# Patient Record
Sex: Female | Born: 1985 | Race: White | Hispanic: No | Marital: Married | Smoking: Never smoker
Health system: Southern US, Community
[De-identification: ages and names within clinical notes are randomized; demographics above are authoritative.]

## PROBLEM LIST (undated history)

## (undated) ENCOUNTER — Inpatient Hospital Stay (HOSPITAL_COMMUNITY): Payer: Self-pay

## (undated) DIAGNOSIS — Z973 Presence of spectacles and contact lenses: Secondary | ICD-10-CM

## (undated) DIAGNOSIS — K219 Gastro-esophageal reflux disease without esophagitis: Secondary | ICD-10-CM

## (undated) DIAGNOSIS — Z141 Cystic fibrosis carrier: Secondary | ICD-10-CM

## (undated) DIAGNOSIS — J189 Pneumonia, unspecified organism: Secondary | ICD-10-CM

## (undated) DIAGNOSIS — O021 Missed abortion: Secondary | ICD-10-CM

## (undated) DIAGNOSIS — F32A Depression, unspecified: Secondary | ICD-10-CM

## (undated) HISTORY — DX: Cystic fibrosis carrier: Z14.1

---
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
## 2010-04-12 ENCOUNTER — Ambulatory Visit (INDEPENDENT_AMBULATORY_CARE_PROVIDER_SITE_OTHER): Payer: BC Managed Care – PPO | Admitting: Family Medicine

## 2010-04-12 ENCOUNTER — Encounter: Payer: Self-pay | Admitting: Family Medicine

## 2010-04-12 DIAGNOSIS — J029 Acute pharyngitis, unspecified: Secondary | ICD-10-CM

## 2010-04-12 DIAGNOSIS — J069 Acute upper respiratory infection, unspecified: Secondary | ICD-10-CM

## 2010-04-14 ENCOUNTER — Encounter: Payer: Self-pay | Admitting: Family Medicine

## 2010-04-20 NOTE — Assessment & Plan Note (Signed)
Summary: SORET HROAT,PAIN IN EARS,RUNNY NOSE /WSE Room 4   Vital Signs:  Patient Profile:   25 Years Old Female CC:      Sore throat, ear pain, congestion, chills x 3 days Height:     65.5 inches Weight:      105 pounds O2 Sat:      99 % O2 treatment:    Room Air Temp:     99.0 degrees F oral Pulse rate:   76 / minute Pulse rhythm:   regular Resp:     12 per minute BP sitting:   127 / 84  (left arm) Cuff size:   regular  Vitals Entered By: Emilio Math (April 12, 2010 8:05 PM)                  Current Allergies: ! PENICILLINHistory of Present Illness Chief Complaint: Sore throat, ear pain, congestion, chills x 3 days History of Present Illness:  Subjective: Patient complains of sore throat for 2 days. No cough No pleuritic pain No wheezing + nasal congestion ? post-nasal drainage No sinus pain/pressure No itchy/red eyes ? earache No hemoptysis No SOB No fever, + chills No nausea No vomiting No abdominal pain No diarrhea No skin rashes + fatigue No myalgias No headache Used OTC meds without relief   REVIEW OF SYSTEMS Constitutional Symptoms       Complains of chills.     Denies fever, night sweats, weight loss, weight gain, and fatigue.  Eyes       Denies change in vision, eye pain, eye discharge, glasses, contact lenses, and eye surgery. Ear/Nose/Throat/Mouth       Complains of ear pain, sinus problems, and sore throat.      Denies hearing loss/aids, change in hearing, ear discharge, dizziness, frequent runny nose, frequent nose bleeds, hoarseness, and tooth pain or bleeding.  Respiratory       Denies dry cough, productive cough, wheezing, shortness of breath, asthma, bronchitis, and emphysema/COPD.  Cardiovascular       Denies murmurs, chest pain, and tires easily with exhertion.    Gastrointestinal       Denies stomach pain, nausea/vomiting, diarrhea, constipation, blood in bowel movements, and indigestion. Genitourniary       Denies painful  urination, kidney stones, and loss of urinary control. Neurological       Denies paralysis, seizures, and fainting/blackouts. Musculoskeletal       Denies muscle pain, joint pain, joint stiffness, decreased range of motion, redness, swelling, muscle weakness, and gout.  Skin       Denies bruising, unusual mles/lumps or sores, and hair/skin or nail changes.  Psych       Denies mood changes, temper/anger issues, anxiety/stress, speech problems, depression, and sleep problems.  Past History:  Past Medical History: Unremarkable  Past Surgical History: Denies surgical history  Family History: Mother, Fibromyalgia Father, Healthy  Social History: Non smoker [facility]   Objective:  No acute distress  Eyes:  Pupils are equal, round, and reactive to light and accomdation.  Extraocular movement is intact.  Conjunctivae are not inflamed.  Ears:  Canals normal.  Tympanic membranes normal.   Nose:  Mildly congested; no sinus tenderness Pharynx:  Minimal erythema Neck:  Supple.  Tender shotty posterior nodes are palpated bilaterally.  Lungs:  Clear to auscultation.  Breath sounds are equal.  Heart:  Regular rate and rhythm without murmurs, rubs, or gallops.  Abdomen:  Nontender without masses or hepatosplenomegaly.  Bowel sounds are present.  No CVA or flank tenderness.  Skin:  No rash Rapid strep test negative  Assessment New Problems: UPPER RESPIRATORY INFECTION, ACUTE (ICD-465.9) ACUTE PHARYNGITIS (ICD-462)  NO EVIDENCE BACTERIAL INFECTION TODAY.  SUSPECT EARLY VIRAL URI  Plan New Medications/Changes: AZITHROMYCIN 250 MG TABS (AZITHROMYCIN) Two tabs by mouth on day 1, then 1 tab daily on days 2 through 5 (Rx void after 04/20/10)  #6 tabs x 0, 04/12/2010, Donna Christen MD BENZONATATE 200 MG CAPS (BENZONATATE) One by mouth hs as needed cough  #12 x 0, 04/12/2010, Donna Christen MD  New Orders: Rapid Strep [[address]] T-Culture, Throat [60454-09811] Pulse Oximetry  (single measurment) [91478] New Patient Level III [29562] Planning Comments:   Throat culture pending Treat symptomatically for now:  Increase fluid intake, begin expectorant/decongestant, cough suppressant at bedtime.  If throat culture positive, if fever/chills/sweats persist, or if not improving 5 days begin Z-pack (given Rx to hold).  Followup with PCP if not improving 7 to 10 days.   The patient and/or caregiver has been counseled thoroughly with regard to medications prescribed including dosage, schedule, interactions, rationale for use, and possible side effects and they verbalize understanding.  Diagnoses and expected course of recovery discussed and will return if not improved as expected or if the condition worsens. Patient and/or caregiver verbalized understanding.  Prescriptions: AZITHROMYCIN 250 MG TABS (AZITHROMYCIN) Two tabs by mouth on day 1, then 1 tab daily on days 2 through 5 (Rx void after 04/20/10)  #6 tabs x 0   Entered and Authorized by:   Donna Christen MD   Signed by:   Donna Christen MD on 04/12/2010   Method used:   Print then Give to Patient   RxID:   [id number] BENZONATATE 200 MG CAPS (BENZONATATE) One by mouth hs as needed cough  #12 x 0   Entered and Authorized by:   Donna Christen MD   Signed by:   Donna Christen MD on 04/12/2010   Method used:   Print then Give to Patient   RxID:   [id number]   Patient Instructions: 1)  Take Mucinex D (guaifenesin with decongestant) twice daily for congestion. 2)  Increase fluid intake, rest. 3)  May take Ibuprofen 200mg , 3 or 4 tabs every 8 hours with food for sore throat, fever, etc. 4)  May use Afrin nasal spray (or generic oxymetazoline) twice daily for about 5 days.  Also recommend using saline nasal spray several times daily and/or saline nasal irrigation. 5)  Begin Azithromycin if throat culture positive, if not improving about 5 days, or if persistent fever develops. 6)  Followup with family doctor if not  improving 7 to 10 days.   Orders Added: 1)  Rapid Strep [87880] 2)  T-Culture, Throat [[id number]] 3)  Pulse Oximetry (single measurment) [94760] 4)  New Patient Level III [99203]  Appended Document: SORET HROAT,PAIN IN EARS,RUNNY NOSE /WSE Room 4 Rapid strep: Negative

## 2010-04-20 NOTE — Letter (Signed)
Summary: Handout Printed  Printed Handout:  - Rheumatic Fever 

## 2010-04-20 NOTE — Letter (Signed)
Summary: Out of Work  [facility]  [address]   [place], [place] [address]   Phone: [phone]  Fax: [phone]    April 12, 2010   Employee:  Lamount Cohen    To Whom It May Concern:   For Medical reasons, please excuse the above named employee from work tomorrow.      If you need additional information, please feel free to contact our office.         Sincerely,    Donna Christen MD

## 2010-11-11 ENCOUNTER — Encounter: Payer: Self-pay | Admitting: Emergency Medicine

## 2010-11-11 ENCOUNTER — Inpatient Hospital Stay (INDEPENDENT_AMBULATORY_CARE_PROVIDER_SITE_OTHER)
Admission: RE | Admit: 2010-11-11 | Discharge: 2010-11-11 | Disposition: A | Discharge status: Home / Self Care | Payer: BC Managed Care – PPO | Source: Ambulatory Visit | Attending: Emergency Medicine | Admitting: Emergency Medicine

## 2010-11-11 DIAGNOSIS — J00 Acute nasopharyngitis [common cold]: Secondary | ICD-10-CM | POA: Insufficient documentation

## 2010-11-11 DIAGNOSIS — E860 Dehydration: Secondary | ICD-10-CM | POA: Insufficient documentation

## 2010-11-11 DIAGNOSIS — J029 Acute pharyngitis, unspecified: Secondary | ICD-10-CM

## 2010-11-11 LAB — CONVERTED CEMR LAB
Beta hcg, urine, semiquantitative: NEGATIVE
Bilirubin Urine: NEGATIVE
Blood in Urine, dipstick: NEGATIVE
Glucose, Urine, Semiquant: NEGATIVE
Heterophile Ab Screen: NEGATIVE
Inflenza A Ag: NEGATIVE
Influenza B Ag: NEGATIVE
Nitrite: NEGATIVE
Protein, U semiquant: NEGATIVE
Rapid Strep: NEGATIVE
Specific Gravity, Urine: 1.02
Urobilinogen, UA: 0.2
WBC Urine, dipstick: NEGATIVE
pH: 7.5

## 2010-11-15 ENCOUNTER — Telehealth (INDEPENDENT_AMBULATORY_CARE_PROVIDER_SITE_OTHER): Payer: Self-pay | Admitting: *Deleted

## 2011-01-03 NOTE — Progress Notes (Signed)
Summary: Fever/chills/ear pain/dizzy (rm 3)   Vital Signs:  Patient Profile:   25 Years Old Female CC:      dizziness, congestion, nausea, chills, HA x 1 week LMP:     10/23/2010 Height:     65.5 inches Weight:      108 pounds O2 Sat:      100 % O2 treatment:    Room Air Temp:     98.6 degrees F oral Pulse rate:   69 / minute Resp:     14 per minute BP sitting:   125 / 80  (left arm) Cuff size:   regular  Vitals Entered By: Lajean Saver RN (November 11, 2010 2:23 PM)  Menstrual History: LMP (date): 10/23/2010                  Updated Prior Medication List: * TRISPRINTEC   Current Allergies (reviewed today): ! PENICILLINHistory of Present Illness History from: patient and husband Chief Complaint: dizziness, congestion, nausea, chills, HA x 1 week History of Present Illness: Pt complains of 7 days of dizziness, congestion, nausea, chills, HA, myalgias.  Not improving with otc meds. + mild sore throat. + cough, + L ear pain. No dyspnea. No rash. No chest pain. No wheezing.  + nausea No vomiting, but + "dry heaves".  No diarrhea. Poor appetitie. + fever, + chills . Husband asymptomatic. Married last month. + mild colored rhinorrhea. +Fatigue and lightheaded without syncope. No gyn or gu sxs. No tick bite.  REVIEW OF SYSTEMS Constitutional Symptoms       Complains of chills and fatigue.     Denies fever, night sweats, weight loss, and weight gain.  Eyes       Denies change in vision, eye pain, eye discharge, glasses, contact lenses, and eye surgery. Ear/Nose/Throat/Mouth       Complains of ear pain and dizziness.      Denies hearing loss/aids, change in hearing, ear discharge, frequent runny nose, frequent nose bleeds, sinus problems, sore throat, hoarseness, and tooth pain or bleeding.  Respiratory       Complains of dry cough.      Denies productive cough, wheezing, shortness of breath, asthma, bronchitis, and emphysema/COPD.  Cardiovascular       Denies  murmurs, chest pain, and tires easily with exhertion.    Gastrointestinal       Complains of nausea/vomiting.      Denies stomach pain, diarrhea, constipation, blood in bowel movements, and indigestion.      Comments: nausea only Genitourniary       Denies painful urination, blood or discharge from vagina, kidney stones, and loss of urinary control. Neurological       Complains of weakness.      Denies headaches, loss of or changes in sensation, numbness, tngling, tremors, paralysis, seizures, and fainting/blackouts. Musculoskeletal       Denies muscle pain, joint pain, joint stiffness, decreased range of motion, redness, swelling, muscle weakness, and gout.  Skin       Denies bruising, unusual mles/lumps or sores, and hair/skin or nail changes.  Psych       Denies mood changes, temper/anger issues, anxiety/stress, speech problems, depression, and sleep problems. Other Comments: Symptoms started 1 week ago. Taken Mucinex D   Past History:  Past Medical History: Reviewed history from 04/12/2010 and no changes required. Unremarkable  Past Surgical History: Reviewed history from 04/12/2010 and no changes required. Denies surgical history  Social History: Non smoker [employer] [facility] [employer] Married  Physical Exam General appearance: Very fatigued. Cooperative. Appears Toxic. Here with husband. Lying on exam table. No cardioresp distress. Head: normocephalic, atraumatic Eyes: conjunctivae and lids normal Ears: normal, no lesions or deformities Nasal: clear discharge Oral/Pharynx: pharyngeal erythema without exudate, uvula midline without deviation. Oral mucus membranes dry. Neck: supple,anterior lymphadenopathy present. No meningeal signs. Chest/Lungs: no rales, wheezes, or rhonchi bilateral, breath sounds equal without effort Heart: Tachy; regular rate and  rhythm, no murmur Abdomen: soft, non-tender without obvious organomegaly GU: No cva tenderness Extremities: normal  extremities.  Neg Homans. Muscles and skin are diffusely tender and sensitive to palpation. Neurological: grossly intact and non-focal Skin: Warm. no obvious rashes or lesions.  Turgor decreased. MSE: oriented to time, place, and person Assessment New Problems: DEHYDRATION (ICD-276.51) ACUTE NASOPHARYNGITIS (ICD-460) ACUTE PHARYNGITIS (ICD-462)  stat rapid strep test, flu A and B tests, and monospot tests all NEG. UA and urine HCG neg.  Plan New Orders: Flu A+B [87400] Rapid Strep [87880] Monospot [86308] Est. Patient Level IV [[address]] Planning Comments:   Discussed with pt and husband.  Dx unclear. In my opinion, has febrile illness, likely viral syndrome, with dehydration.  She needs IV fluids and further w/u and care beyond the scope of our facility, which is an urgent care center. Explained to pt and husband. Husband will take Chelcea now to [facility] Emergency Department.   The patient and/or caregiver has been counseled thoroughly with regard to medications prescribed including dosage, schedule, interactions, rationale for use, and possible side effects and they verbalize understanding.  Diagnoses and expected course of recovery discussed and will return if not improved as expected or if the condition worsens. Patient and/or caregiver verbalized understanding.   Orders Added: 1)  Flu A+B [87400] 2)  Rapid Strep [87880] 3)  Monospot [86308] 4)  Est. Patient Level IV [60454]    Laboratory Results   Urine Tests  Date/Time Received: November 11, 2010 3:31 PM  Date/Time Reported: November 11, 2010 3:31 PM   Routine Urinalysis   Color: yellow Appearance: Clear Glucose: negative   (Normal Range: Negative) Bilirubin: negative   (Normal Range: Negative) Ketone: 1+   (Normal Range: Negative) Spec. Gravity: 1.020   (Normal Range: 1.003-1.035) Blood: negative   (Normal Range: Negative) pH: 7.5   (Normal Range: 5.0-8.0) Protein: negative   (Normal Range:  Negative) Urobilinogen: 0.2   (Normal Range: 0-1) Nitrite: negative   (Normal Range: Negative) Leukocyte Esterace: negative   (Normal Range: Negative)    Urine HCG: negative  Blood Tests   Date/Time Received: November 11, 2010 3:11 PM  Date/Time Reported: November 11, 2010 3:11 PM    Mono: negative Date/Time Received: November 11, 2010 3:14 PM  Date/Time Reported: November 11, 2010 3:14 PM   Other Tests  Rapid Strep: negative Influenza A: negative Influenza B: negative

## 2011-01-03 NOTE — Telephone Encounter (Signed)
  Phone Note Outgoing Call Call back at [facility] Phone [phone]   Call placed by: Lajean Saver RN,  November 15, 2010 3:21 PM Call placed to: Patient Action Taken: Phone Call Completed Summary of Call: Call back: No answer. Message left with reason for call and call back with questions or concerns

## 2011-01-14 ENCOUNTER — Encounter: Payer: Self-pay | Admitting: Family Medicine

## 2011-01-14 ENCOUNTER — Ambulatory Visit (INDEPENDENT_AMBULATORY_CARE_PROVIDER_SITE_OTHER): Payer: BC Managed Care – PPO | Admitting: Family Medicine

## 2011-01-14 VITALS — BP 90/51 | HR 77 | Ht 60.0 in | Wt 110.0 lb

## 2011-01-14 DIAGNOSIS — Z13220 Encounter for screening for lipoid disorders: Secondary | ICD-10-CM

## 2011-01-14 DIAGNOSIS — H6980 Other specified disorders of Eustachian tube, unspecified ear: Secondary | ICD-10-CM

## 2011-01-14 DIAGNOSIS — H6990 Unspecified Eustachian tube disorder, unspecified ear: Secondary | ICD-10-CM

## 2011-01-14 DIAGNOSIS — Z8249 Family history of ischemic heart disease and other diseases of the circulatory system: Secondary | ICD-10-CM

## 2011-01-14 DIAGNOSIS — Z3009 Encounter for other general counseling and advice on contraception: Secondary | ICD-10-CM

## 2011-01-14 MED ORDER — NORGESTIM-ETH ESTRAD TRIPHASIC 0.18/0.215/0.25 MG-35 MCG PO TABS: 1.0000 | ORAL_TABLET | Freq: Every day | ORAL | Status: DC

## 2011-01-14 NOTE — Patient Instructions (Signed)
Call if you need anything! 

## 2011-01-14 NOTE — Progress Notes (Signed)
Subjective:    Patient ID: Jennifer Ryan, female    DOB: 1985/11/24, 24 y.o.   MRN: [id number]  HPI Here to estab care.  Left ear ache in the morning. Comes and goes for a month.  No drange. No fever.  Occ ST.    Would like a refill on her OCPS. Happy with current regimen.    Review of Systems  Constitutional: Negative for fever, diaphoresis and unexpected weight change.  HENT: Negative for hearing loss, rhinorrhea and tinnitus.   Eyes: Negative for visual disturbance.  Respiratory: Negative for cough and wheezing.   Cardiovascular: Negative for chest pain and palpitations.  Gastrointestinal: Negative for nausea, vomiting, diarrhea and blood in stool.  Genitourinary: Negative for vaginal bleeding, vaginal discharge and difficulty urinating.  Musculoskeletal: Negative for myalgias and arthralgias.  Skin: Negative for rash.  Neurological: Negative for headaches.  Hematological: Negative for adenopathy. Does not bruise/bleed easily.  Psychiatric/Behavioral: Negative for sleep disturbance and dysphoric mood. The patient is not nervous/anxious.    BP 90/51  Pulse 77  Ht 5' (1.524 m)  Wt 110 lb (49.896 kg)  BMI 21.48 kg/m2    Allergies  Allergen Reactions  . Penicillins Rash    History reviewed. No pertinent past medical history.  Past Surgical History  Procedure Date  . Wisdom tooth extraction 2007    History   Social History  . Marital Status: Married    Spouse Name: [name]    Number of Children: 1   . Years of Education: N/A   Occupational History  . Pharmacy [employer]   Social History Main Topics  . Smoking status: Never Smoker   . Smokeless tobacco: Not on file  . Alcohol Use: No  . Drug Use: No  . Sexually Active: Yes -- Female partner(s)    Birth Control/ Protection: Pill   Other Topics Concern  . Not on file   Social History Narrative    2 caffiene per day. No regular exercise routine.     Family History  Problem Relation Age  of Onset  . Prostate cancer Paternal Grandfather   . Brain cancer      pat great grandfather  . Heart attack Paternal Uncle   . Depression Maternal Aunt   . Diabetes      cousins   . Hyperlipidemia    . Hypertension Paternal Grandfather   . Hypertension Paternal Grandmother        Objective:   Physical Exam  Constitutional: She is oriented to person, place, and time. She appears well-developed and well-nourished.  HENT:  Head: Normocephalic and atraumatic.  Right Ear: External ear normal.  Left Ear: External ear normal.  Nose: Nose normal.  Mouth/Throat: Oropharynx is clear and moist.       TMs and canals are clear.   Eyes: Conjunctivae and EOM are normal. Pupils are equal, round, and reactive to light.  Neck: Neck supple. No thyromegaly present.  Cardiovascular: Normal rate, regular rhythm and normal heart sounds.   Pulmonary/Chest: Effort normal and breath sounds normal. She has no wheezes.  Lymphadenopathy:    She has no cervical adenopathy.  Neurological: She is alert and oriented to person, place, and time.  Skin: Skin is warm and dry.  Psychiatric: She has a normal mood and affect.          Assessment & Plan:  Birth control - Refilled medication. Last pap was 05/2010.  Gve 6 months worth, then need repeat  pap smear  Left ear Pain - Likley eustachian tube dysfunciton.  Exam is nl. Discussed can try a decongetant or an antihistamine if gets more bothersome.

## 2011-07-05 ENCOUNTER — Encounter: Payer: Self-pay | Admitting: *Deleted

## 2011-07-05 ENCOUNTER — Ambulatory Visit (INDEPENDENT_AMBULATORY_CARE_PROVIDER_SITE_OTHER): Payer: BC Managed Care – PPO | Admitting: *Deleted

## 2011-07-05 VITALS — BP 119/71 | Temp 97.5°F | Wt 111.0 lb

## 2011-07-05 DIAGNOSIS — Z3400 Encounter for supervision of normal first pregnancy, unspecified trimester: Secondary | ICD-10-CM

## 2011-07-05 NOTE — Progress Notes (Signed)
p-98  This is a G1 P0 who is here with here husband for their NOB intake.  She is the sister of a previous pt who delivered through our practice.  They are happy about this pregnancy.  He has a child from a previous marriage.  Bedside U/S today shows CRL of 17.44mm and FHT of 178bpm. Her last pap was 1 year ago and denies any abnormal paps.  She is currently taking prenatal vitamins.  She was counseled about 1ST Trimester screening and will decide by her next appt which will be in about 2 weeks

## 2011-07-05 NOTE — Progress Notes (Signed)
Addended by: Granville Lewis on: 07/05/2011 11:34 AM   Modules accepted: Level of Service

## 2011-07-06 LAB — OBSTETRIC PANEL
Basophils Relative: 0 % (ref 0–1)
Hemoglobin: 13.1 g/dL (ref 12.0–15.0)
Hepatitis B Surface Ag: NEGATIVE
Lymphs Abs: 1.2 10*3/uL (ref 0.7–4.0)
MCHC: 32.5 g/dL (ref 30.0–36.0)
Monocytes Relative: 7 % (ref 3–12)
Neutro Abs: 7.4 10*3/uL (ref 1.7–7.7)
Neutrophils Relative %: 79 % — ABNORMAL HIGH (ref 43–77)
Platelets: 295 10*3/uL (ref 150–400)
RBC: 4.46 MIL/uL (ref 3.87–5.11)
Rh Type: POSITIVE

## 2011-07-06 LAB — GC/CHLAMYDIA PROBE AMP, URINE
Chlamydia, Swab/Urine, PCR: NEGATIVE
GC Probe Amp, Urine: NEGATIVE

## 2011-07-07 LAB — CULTURE, URINE COMPREHENSIVE: Organism ID, Bacteria: NO GROWTH

## 2011-07-14 LAB — CYSTIC FIBROSIS DIAGNOSTIC STUDY

## 2011-07-15 ENCOUNTER — Telehealth: Payer: Self-pay | Admitting: *Deleted

## 2011-07-15 DIAGNOSIS — IMO0002 Reserved for concepts with insufficient information to code with codable children: Secondary | ICD-10-CM

## 2011-07-15 NOTE — Telephone Encounter (Signed)
Appt made with genetic counseling @ [facility] 07/28/11 @ 1:00

## 2011-07-17 ENCOUNTER — Encounter: Payer: Self-pay | Admitting: Obstetrics & Gynecology

## 2011-07-17 DIAGNOSIS — Z141 Cystic fibrosis carrier: Secondary | ICD-10-CM | POA: Insufficient documentation

## 2011-07-19 ENCOUNTER — Encounter (HOSPITAL_COMMUNITY): Payer: Self-pay | Admitting: Obstetrics & Gynecology

## 2011-07-22 ENCOUNTER — Ambulatory Visit (INDEPENDENT_AMBULATORY_CARE_PROVIDER_SITE_OTHER): Payer: BC Managed Care – PPO | Admitting: Advanced Practice Midwife

## 2011-07-22 DIAGNOSIS — Z3480 Encounter for supervision of other normal pregnancy, unspecified trimester: Secondary | ICD-10-CM

## 2011-07-22 NOTE — Progress Notes (Signed)
p-85  C/O's lower back pain

## 2011-07-22 NOTE — Progress Notes (Signed)
  Subjective:    Jennifer Ryan is a G1P0 [redacted]w[redacted]d being seen today for her first obstetrical visit.  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 Patient does intend to breast feed. Pregnancy history fully reviewed. OB labs reviewed.  Patient reports no bleeding, no contractions, no cramping and no leaking.  Filed Vitals:   07/22/11 0928  BP: 115/62  Temp: 98 F (36.7 C)  Weight: 50.349 kg (111 lb)    HISTORY: OB History    Grav Para Term Preterm Abortions TAB SAB Ect Mult Living   1              # Outc Date GA Lbr Len/2nd Wgt Sex Del Anes PTL Lv   1 CUR              No past medical history on file. Past Surgical History  Procedure Date  . Wisdom tooth extraction 2007   Family History  Problem Relation Age of Onset  . Prostate cancer Paternal Grandfather   . Brain cancer      pat great grandfather  . Heart attack Paternal Uncle   . Depression Maternal Aunt   . Diabetes      cousins   . Hyperlipidemia    . Hypertension Paternal Grandfather   . Hypertension Paternal Grandmother      Exam    Uterus:     Pelvic Exam:    Perineum: Deferred   Vulva: Deferred   Vagina:  Deferred   pH: NA   Cervix: Deferred   Adnexa: not evaluated   Bony Pelvis: Deferred  System: Breast:  normal appearance, no masses or tenderness   Skin: normal coloration and turgor, no rashes    Neurologic: oriented, normal, gait normal; reflexes normal and symmetric   Extremities: normal strength, tone, and muscle mass   HEENT PERRLA, sclera clear, anicteric and thyroid without masses   Mouth/Teeth mucous membranes moist, pharynx normal without lesions   Neck supple   Cardiovascular: regular rate and rhythm   Respiratory:  appears well, vitals normal, no respiratory distress, acyanotic, normal RR, neck free of mass or lymphadenopathy, chest clear, no wheezing, crepitations, rhonchi, normal symmetric air entry   Abdomen: soft, non-tender; bowel sounds normal; no masses,  no  organomegaly   Urinary: Deferred      Assessment:    Pregnancy: G1P0 Patient Active Problem List  Diagnosis  . Cystic fibrosis gene carrier   Plan:   Initial labs drawn. Prenatal vitamins. Problem list reviewed and updated. Genetic Screening discussed First Screen: undecided. Will call Monday to schedule PRN   Ultrasound discussed; fetal survey: at 18-20 weeks.  Follow up in 4 weeks. 75% of 30 min visit spent on counseling and coordination of care.  Husband scheduled for CF carrier testing.  Dorathy Kinsman 07/22/2011

## 2011-07-28 ENCOUNTER — Ambulatory Visit (HOSPITAL_COMMUNITY)
Admission: RE | Admit: 2011-07-28 | Discharge: 2011-07-28 | Disposition: A | Discharge status: Home / Self Care | Payer: BC Managed Care – PPO | Source: Ambulatory Visit | Attending: Obstetrics & Gynecology | Admitting: Obstetrics & Gynecology

## 2011-07-28 ENCOUNTER — Other Ambulatory Visit: Payer: Self-pay

## 2011-07-28 NOTE — Progress Notes (Signed)
Genetic Counseling  High-Risk Gestation Note  Appointment Date:  07/28/2011 Referred By: Lesly Dukes, MD Date of Birth:  Jan 11, 1986   Pregnancy History: G1P0 Estimated Date of Delivery: 02/09/12 Estimated Gestational Age: [redacted]w[redacted]d Attending: Rica Koyanagi, MD   I met with Mrs. Robbie Lis and her husband, Mr. 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    We reviewed the results of Mrs. Cabana CF carrier screening.  Specifically, the name of the CFTR gene mutation she carries is 2789+5G>A. CF carrier screening has not yet been performed for her husband.  Mrs. Zuercher reported no additional relatives known to be CF carriers and no known relatives with cystic fibrosis.  Mr. Dlugosz reported no known individuals with cystic fibrosis in his family history, and consanguinity to Mrs. Searcy was denied. He reported [place], [place], [place], [place], and [place] ancestry. Both family histories were reviewed and were otherwise negative for birth defects, mental retardation, and known genetic conditions. Without further information regarding the provided family history, an accurate genetic risk cannot be calculated. Further genetic counseling is warranted if more information is obtained.  The couple was provided with written information regarding cystic fibrosis. Classic features of CF include thickened secretions in the lungs, digestive and reproductive systems.  This life-limiting condition is characterized by chronic respiratory infections requiring daily chest therapies, pancreatic dysfunction disrupting the body's ability to break down food and extract nutrients as it should, which may restrict growth.  Infertility commonly occurs in males.  With therapies, such as daily respiratory therapies and medications to aid digestion, the median lifespan for people with CF is now mid-30's. Treatment  may involve lung transplantation in some cases. There can be significant variability in the severity of symptoms and expression of the disease; severity cannot be predicted prenatally.  We discussed that 2789+5G>A mutation is associated in the literature with milder forms of the disease. However, expression of the disease may be modified depending upon the second mutation present in an individual with CF.   We spent time reviewing the autosomal recessive inheritance of cystic fibrosis. Cystic fibrosis (CF) is a common genetic condition in the Caucasian population occurring in approximately 1 in 2,500-3,300 Caucasian births.  This means approximately 1 in 29 Caucasians is a CF carrier.  We discussed that individuals who are carriers have one copy of the CFTR gene with a disease causing mutation, and their other CFTR gene copy functions correctly. Thus, carriers typically do not have associated medical symptoms. We discussed that when both parents are carriers for CF, each pregnancy has an independent chance for one of the following outcomes: a 25% chance to inherit both mutations and thus have CF; a 50% chance to inherit one gene mutation and be a carrier similar to parents; and a 25% chance to be neither a carrier nor have CF. When one parent is a CF carrier but the other is not, then each pregnancy has a 1 in 2 chance to be a CF carrier but would not be expected to be at increased risk to inherit CF.   There are thought to be thousands of mutations which can cause the CFTR gene to not function properly. Carrier screening is available to assess for the most common disease causing mutations. However, carrier screening does not identify all CF carriers. Thus, a negative CF carrier screen would reduce but not eliminate the chance to be a CF carrier and thus  the chance for CF in a pregnancy.  Carrier screening for the most common mutations detects approximately 90% of carriers in the Caucasian population.  Given  Mr. Grimme' reported family history, he would have the general population risk to be a carrier of approximately 1 in 29, prior to carrier screening. Thus, given that Mrs. Deryl [place] is a known CF carrier, the risk for CF to be in the current pregnancy, prior to carrier screening for Mr. Tetzloff, is approximately 1 in 116 (0.9%).  We discussed that CF carrier screening for Mr. Lampe would further refine the risk for CF in the current pregnancy. After careful consideration, Mr. Pigman elected to proceed with CF carrier screening using the extended panel (97 mutation) through [employer], which was performed at the time of today's visit. We will contact the patient and her partner once these results are available and forward to her OB office.  We reviewed that when both parents are identified to be CF carriers, prenatal diagnosis via amniocentesis (or chorionic villus sampling in the first trimester) would be available, if desired. The risks, benefits, and limitations of amniocentesis were reviewed. A fetus with cystic fibrosis typically appears normal on targeted ultrasound, although rarely echogenic bowel is visualized on ultrasound. However, the presence of echogenic bowel on targeted ultrasound is not diagnostic for CF in a pregnancy, nor does the absence of echogenic bowel on ultrasound rule out CF in the pregnancy. We discussed that postnatal testing for CF can also be performed for babies identified to be at risk to inherit CF. They understand that in [place], the newborn screening test will detect CF, but that carriers may come back as false positives.  Mrs. Chantae Soo [facility] and her partner stated that they would not be interested in prenatal diagnosis for CF via amniocentesis, even in the event that Mr. Necaise was also identified to be a CF carrier given the associated risk of complications.  Mrs. Osley denied exposure to environmental toxins or chemical agents. She denied the  use of alcohol, tobacco or street drugs. She denied significant viral illnesses during the course of her pregnancy. Her medical and surgical histories were noncontributory.   I counseled this couple regarding the above risks and available options.  The approximate face-to-face time with the genetic counselor was  minutes.   Quinn Plowman, MS Certified Genetic Counselor 07/28/2011

## 2011-08-09 ENCOUNTER — Telehealth (HOSPITAL_COMMUNITY): Payer: Self-pay | Admitting: MS"

## 2011-08-09 NOTE — Telephone Encounter (Signed)
Called Mr. Jennifer Ryan, husband of [name] [facility] regarding result of his cystic fibrosis carrier screening. Discussed that screening is negative for the 97 mutations analyzed. Thus, his risk to be a CF carrier has been reduced to approximately 1 in 343 (0.3%), and the risk for cystic fibrosis in the current pregnancy has been reduced to approximately 1 in 1,372 (0.07%). [name] was encouraged to call back with additional questions or concerns.   [name] 08/09/2011  1:04 PM

## 2011-08-25 ENCOUNTER — Ambulatory Visit (INDEPENDENT_AMBULATORY_CARE_PROVIDER_SITE_OTHER): Payer: BC Managed Care – PPO | Admitting: Obstetrics & Gynecology

## 2011-08-25 ENCOUNTER — Encounter: Payer: Self-pay | Admitting: Obstetrics & Gynecology

## 2011-08-25 VITALS — BP 111/65 | Wt 116.0 lb

## 2011-08-25 DIAGNOSIS — Z3400 Encounter for supervision of normal first pregnancy, unspecified trimester: Secondary | ICD-10-CM

## 2011-08-25 NOTE — Progress Notes (Signed)
Routine visit. No problems. She is undecided about a quad screen and knows that she needs to decide by her next visit. I will schedule an anatomy scan for 19 weeks.

## 2011-08-25 NOTE — Progress Notes (Signed)
Routine prenatal check doing well.

## 2011-09-05 ENCOUNTER — Encounter: Payer: Self-pay | Admitting: Obstetrics & Gynecology

## 2011-09-05 ENCOUNTER — Other Ambulatory Visit: Payer: Self-pay | Admitting: Obstetrics & Gynecology

## 2011-09-05 ENCOUNTER — Ambulatory Visit (HOSPITAL_COMMUNITY)
Admission: RE | Admit: 2011-09-05 | Discharge: 2011-09-05 | Disposition: A | Discharge status: Home / Self Care | Payer: Medicaid Other | Source: Ambulatory Visit | Attending: Obstetrics & Gynecology | Admitting: Obstetrics & Gynecology

## 2011-09-05 DIAGNOSIS — Z3400 Encounter for supervision of normal first pregnancy, unspecified trimester: Secondary | ICD-10-CM

## 2011-09-05 DIAGNOSIS — Z1389 Encounter for screening for other disorder: Secondary | ICD-10-CM | POA: Insufficient documentation

## 2011-09-05 DIAGNOSIS — O358XX0 Maternal care for other (suspected) fetal abnormality and damage, not applicable or unspecified: Secondary | ICD-10-CM | POA: Insufficient documentation

## 2011-09-05 DIAGNOSIS — Z363 Encounter for antenatal screening for malformations: Secondary | ICD-10-CM | POA: Insufficient documentation

## 2011-09-23 ENCOUNTER — Ambulatory Visit (INDEPENDENT_AMBULATORY_CARE_PROVIDER_SITE_OTHER): Payer: Medicaid Other | Admitting: Advanced Practice Midwife

## 2011-09-23 VITALS — BP 115/55 | Temp 97.4°F | Wt 119.0 lb

## 2011-09-23 DIAGNOSIS — Z3490 Encounter for supervision of normal pregnancy, unspecified, unspecified trimester: Secondary | ICD-10-CM | POA: Insufficient documentation

## 2011-09-23 DIAGNOSIS — Z3480 Encounter for supervision of other normal pregnancy, unspecified trimester: Secondary | ICD-10-CM

## 2011-09-23 DIAGNOSIS — Z141 Cystic fibrosis carrier: Secondary | ICD-10-CM

## 2011-09-23 NOTE — Progress Notes (Signed)
Reports few sharp groin pain at end of work day c/w round ligamant pains. Anatomy scan normal, but incomplete. F/U scheduled.  Husband neg CF carrier.

## 2011-09-23 NOTE — Progress Notes (Signed)
[id number] 

## 2011-10-06 ENCOUNTER — Ambulatory Visit (HOSPITAL_COMMUNITY)
Admission: RE | Admit: 2011-10-06 | Discharge: 2011-10-06 | Disposition: A | Discharge status: Home / Self Care | Payer: Medicaid Other | Source: Ambulatory Visit | Attending: Advanced Practice Midwife | Admitting: Advanced Practice Midwife

## 2011-10-06 DIAGNOSIS — Z3490 Encounter for supervision of normal pregnancy, unspecified, unspecified trimester: Secondary | ICD-10-CM

## 2011-10-06 DIAGNOSIS — Z3689 Encounter for other specified antenatal screening: Secondary | ICD-10-CM | POA: Insufficient documentation

## 2011-10-06 NOTE — Progress Notes (Signed)
Ms. Lippert had an ultrasound appointment today.  Please see [facility] report for details.  Comments An active singleton fetus is observed.  Biometry is appropriate for gestational age.  Amniotic fluid volume is normal.  Screening survey of the fetal anatomy was performed and no dysmorphic features are detected, noting that imaging of the fetal profile is suboptimal due to position.  Impression Active singleton fetus Normal anatomic survey on attempted comprehensive anatomic survey limited by position. Fetal profile is not well seen.  Recommendations Follow up ultrasound in 4 weeks to complete anatomic survey of the fetal profile.    Rogelia Boga, MD, MS, FACOG Assistant Professor Section of Maternal-Fetal Medicine [facility]

## 2011-10-08 ENCOUNTER — Encounter (HOSPITAL_COMMUNITY): Payer: Self-pay | Admitting: Obstetrics and Gynecology

## 2011-10-08 ENCOUNTER — Inpatient Hospital Stay (HOSPITAL_COMMUNITY)
Admission: AD | Admit: 2011-10-08 | Discharge: 2011-10-08 | Disposition: A | Discharge status: Home / Self Care | Payer: Medicaid Other | Source: Ambulatory Visit | Attending: Obstetrics and Gynecology | Admitting: Obstetrics and Gynecology

## 2011-10-08 DIAGNOSIS — Z3490 Encounter for supervision of normal pregnancy, unspecified, unspecified trimester: Secondary | ICD-10-CM

## 2011-10-08 DIAGNOSIS — O99891 Other specified diseases and conditions complicating pregnancy: Secondary | ICD-10-CM | POA: Insufficient documentation

## 2011-10-08 DIAGNOSIS — O26899 Other specified pregnancy related conditions, unspecified trimester: Secondary | ICD-10-CM

## 2011-10-08 DIAGNOSIS — R109 Unspecified abdominal pain: Secondary | ICD-10-CM | POA: Insufficient documentation

## 2011-10-08 DIAGNOSIS — Z3480 Encounter for supervision of other normal pregnancy, unspecified trimester: Secondary | ICD-10-CM

## 2011-10-08 LAB — URINALYSIS, ROUTINE W REFLEX MICROSCOPIC
Ketones, ur: NEGATIVE mg/dL
Leukocytes, UA: NEGATIVE
Nitrite: NEGATIVE
Protein, ur: NEGATIVE mg/dL

## 2011-10-08 MED ORDER — LACTATED RINGERS IV BOLUS (SEPSIS)
1000.0000 mL | Freq: Once | INTRAVENOUS | Status: AC
  Administered 2011-10-08: 1000 mL via INTRAVENOUS

## 2011-10-08 NOTE — MAU Note (Signed)
Pt [place] and started feeling sharp abd pain and pelvic pressure. Pt unable to walk Husband had to carry her.

## 2011-10-08 NOTE — Discharge Instructions (Signed)
Abdominal Pain During Pregnancy Belly (abdominal) pain is common during pregnancy. Most of the time, it is not a serious problem. Other times, it can be a sign that something is wrong with the pregnancy. Always tell your doctor if you have belly pain. HOME CARE For mild pain:  Do not have sex (intercourse) or put anything in your vagina until you feel better.   Rest until your pain stops. If your pain lasts longer than 1 hour, call your doctor.   Drink clear fluids if you feel sick to your stomach (nauseous).   Do not eat solid food until you feel better.   Only take medicine as told by your doctor.   Keep all doctor visits as told.  GET HELP RIGHT AWAY IF:   You are bleeding, leaking fluid, or pieces of tissue come out of your vagina.   You have more pain or cramping.   You keep throwing up (vomiting).   You have pain when you pee (urinate) or have blood in your pee.   You have a fever.   You do not feel your baby moving as much.   You feel very weak or feel like passing out.   You have trouble breathing, with or without belly pain.   You have a very bad headache and belly pain.   You have fluid leaking from your vagina and belly pain.   You keep having watery poop (diarrhea).   Your belly pain does not go away after resting, or the pain gets worse.  MAKE SURE YOU:   Understand these instructions.   Will watch your condition.   Will get help right away if you are not doing well or get worse.  Document Released: 01/05/2009 Document Revised: 01/06/2011 Document Reviewed: 08/13/2010 [facility] Patient Information 2012 [place], [place]. Pregnancy - Second Trimester The second trimester of pregnancy (3 to 6 months) is a period of rapid growth for you and your baby. At the end of the sixth month, your baby is about 9 inches long and weighs 1 1/2 pounds. You will begin to feel the baby move between 18 and 20 weeks of the pregnancy. This is called quickening. Weight gain is  faster. A clear fluid (colostrum) may leak out of your breasts. You may feel small contractions of the womb (uterus). This is known as false labor or Braxton-Hicks contractions. This is like a practice for labor when the baby is ready to be born. Usually, the problems with morning sickness have usually passed by the end of your first trimester. Some women develop small dark blotches (called cholasma, mask of pregnancy) on their face that usually goes away after the baby is born. Exposure to the sun makes the blotches worse. Acne may also develop in some pregnant women and pregnant women who have acne, may find that it goes away. PRENATAL EXAMS  Blood work may continue to be done during prenatal exams. These tests are done to check on your health and the probable health of your baby. Blood work is used to follow your blood levels (hemoglobin). Anemia (low hemoglobin) is common during pregnancy. Iron and vitamins are given to help prevent this. You will also be checked for diabetes between 24 and 28 weeks of the pregnancy. Some of the previous blood tests may be repeated.   The size of the uterus is measured during each visit. This is to make sure that the baby is continuing to grow properly according to the dates of the pregnancy.  Your blood pressure is checked every prenatal visit. This is to make sure you are not getting toxemia.   Your urine is checked to make sure you do not have an infection, diabetes or protein in the urine.   Your weight is checked often to make sure gains are happening at the suggested rate. This is to ensure that both you and your baby are growing normally.   Sometimes, an ultrasound is performed to confirm the proper growth and development of the baby. This is a test which bounces harmless sound waves off the baby so your caregiver can more accurately determine due dates.  Sometimes, a specialized test is done on the amniotic fluid surrounding the baby. This test is called  an amniocentesis. The amniotic fluid is obtained by sticking a needle into the belly (abdomen). This is done to check the chromosomes in instances where there is a concern about possible genetic problems with the baby. It is also sometimes done near the end of pregnancy if an early delivery is required. In this case, it is done to help make sure the baby's lungs are mature enough for the baby to live outside of the womb. CHANGES OCCURING IN THE SECOND TRIMESTER OF PREGNANCY Your body goes through many changes during pregnancy. They vary from person to person. Talk to your caregiver about changes you notice that you are concerned about.  During the second trimester, you will likely have an increase in your appetite. It is normal to have cravings for certain foods. This varies from person to person and pregnancy to pregnancy.   Your lower abdomen will begin to bulge.   You may have to urinate more often because the uterus and baby are pressing on your bladder. It is also common to get more bladder infections during pregnancy (pain with urination). You can help this by drinking lots of fluids and emptying your bladder before and after intercourse.   You may begin to get stretch marks on your hips, abdomen, and breasts. These are normal changes in the body during pregnancy. There are no exercises or medications to take that prevent this change.   You may begin to develop swollen and bulging veins (varicose veins) in your legs. Wearing support hose, elevating your feet for 15 minutes, 3 to 4 times a day and limiting salt in your diet helps lessen the problem.   Heartburn may develop as the uterus grows and pushes up against the stomach. Antacids recommended by your caregiver helps with this problem. Also, eating smaller meals 4 to 5 times a day helps.   Constipation can be treated with a stool softener or adding bulk to your diet. Drinking lots of fluids, vegetables, fruits, and whole grains are helpful.     Exercising is also helpful. If you have been very active up until your pregnancy, most of these activities can be continued during your pregnancy. If you have been less active, it is helpful to start an exercise program such as walking.   Hemorrhoids (varicose veins in the rectum) may develop at the end of the second trimester. Warm sitz baths and hemorrhoid cream recommended by your caregiver helps hemorrhoid problems.   Backaches may develop during this time of your pregnancy. Avoid heavy lifting, wear low heal shoes and practice good posture to help with backache problems.   Some pregnant women develop tingling and numbness of their hand and fingers because of swelling and tightening of ligaments in the wrist (carpel tunnel syndrome). This goes  away after the baby is born.   As your breasts enlarge, you may have to get a bigger bra. Get a comfortable, cotton, support bra. Do not get a nursing bra until the last month of the pregnancy if you will be nursing the baby.   You may get a dark line from your belly button to the pubic area called the linea nigra.   You may develop rosy cheeks because of increase blood flow to the face.   You may develop spider looking lines of the face, neck, arms and chest. These go away after the baby is born.  HOME CARE INSTRUCTIONS   It is extremely important to avoid all smoking, herbs, alcohol, and unprescribed drugs during your pregnancy. These chemicals affect the formation and growth of the baby. Avoid these chemicals throughout the pregnancy to ensure the delivery of a healthy infant.   Most of your home care instructions are the same as suggested for the first trimester of your pregnancy. Keep your caregiver's appointments. Follow your caregiver's instructions regarding medication use, exercise and diet.   During pregnancy, you are providing food for you and your baby. Continue to eat regular, well-balanced meals. Choose foods such as meat, fish, milk  and other low fat dairy products, vegetables, fruits, and whole-grain breads and cereals. Your caregiver will tell you of the ideal weight gain.   A physical sexual relationship may be continued up until near the end of pregnancy if there are no other problems. Problems could include early (premature) leaking of amniotic fluid from the membranes, vaginal bleeding, abdominal pain, or other medical or pregnancy problems.   Exercise regularly if there are no restrictions. Check with your caregiver if you are unsure of the safety of some of your exercises. The greatest weight gain will occur in the last 2 trimesters of pregnancy. Exercise will help you:   Control your weight.   Get you in shape for labor and delivery.   Lose weight after you have the baby.   Wear a good support or jogging bra for breast tenderness during pregnancy. This may help if worn during sleep. Pads or tissues may be used in the bra if you are leaking colostrum.   Do not use hot tubs, steam rooms or saunas throughout the pregnancy.   Wear your seat belt at all times when driving. This protects you and your baby if you are in an accident.   Avoid raw meat, uncooked cheese, cat litter boxes and soil used by cats. These carry germs that can cause birth defects in the baby.   The second trimester is also a good time to visit your dentist for your dental health if this has not been done yet. Getting your teeth cleaned is OK. Use a soft toothbrush. Brush gently during pregnancy.   It is easier to loose urine during pregnancy. Tightening up and strengthening the pelvic muscles will help with this problem. Practice stopping your urination while you are going to the bathroom. These are the same muscles you need to strengthen. It is also the muscles you would use as if you were trying to stop from passing gas. You can practice tightening these muscles up 10 times a set and repeating this about 3 times per day. Once you know what  muscles to tighten up, do not perform these exercises during urination. It is more likely to contribute to an infection by backing up the urine.   Ask for help if you have financial, counseling  or nutritional needs during pregnancy. Your caregiver will be able to offer counseling for these needs as well as refer you for other special needs.   Your skin may become oily. If so, wash your face with mild soap, use non-greasy moisturizer and oil or cream based makeup.  MEDICATIONS AND DRUG USE IN PREGNANCY  Take prenatal vitamins as directed. The vitamin should contain 1 milligram of folic acid. Keep all vitamins out of reach of children. Only a couple vitamins or tablets containing iron may be fatal to a baby or young child when ingested.   Avoid use of all medications, including herbs, over-the-counter medications, not prescribed or suggested by your caregiver. Only take over-the-counter or prescription medicines for pain, discomfort, or fever as directed by your caregiver. Do not use aspirin.   Let your caregiver also know about herbs you may be using.   Alcohol is related to a number of birth defects. This includes fetal alcohol syndrome. All alcohol, in any form, should be avoided completely. Smoking will cause low birth rate and premature babies.   Street or illegal drugs are very harmful to the baby. They are absolutely forbidden. A baby born to an addicted mother will be addicted at birth. The baby will go through the same withdrawal an adult does.  SEEK MEDICAL CARE IF:  You have any concerns or worries during your pregnancy. It is better to call with your questions if you feel they cannot wait, rather than worry about them. SEEK IMMEDIATE MEDICAL CARE IF:   An unexplained oral temperature above 102 F (38.9 C) develops, or as your caregiver suggests.   You have leaking of fluid from the vagina (birth canal). If leaking membranes are suspected, take your temperature and tell your caregiver  of this when you call.   There is vaginal spotting, bleeding, or passing clots. Tell your caregiver of the amount and how many pads are used. Light spotting in pregnancy is common, especially following intercourse.   You develop a bad smelling vaginal discharge with a change in the color from clear to white.   You continue to feel sick to your stomach (nauseated) and have no relief from remedies suggested. You vomit blood or coffee ground-like materials.   You lose more than 2 pounds of weight or gain more than 2 pounds of weight over 1 week, or as suggested by your caregiver.   You notice swelling of your face, hands, feet, or legs.   You get exposed to [place] measles and have never had them.   You are exposed to fifth disease or chickenpox.   You develop belly (abdominal) pain. Round ligament discomfort is a common non-cancerous (benign) cause of abdominal pain in pregnancy. Your caregiver still must evaluate you.   You develop a bad headache that does not go away.   You develop fever, diarrhea, pain with urination, or shortness of breath.   You develop visual problems, blurry, or double vision.   You fall or are in a car accident or any kind of trauma.   There is mental or physical violence at home.  Document Released: 01/11/2001 Document Revised: 01/06/2011 Document Reviewed: 07/16/2008 [facility] Patient Information 2012 [place], [place]. Fetal Movement Counts Patient Name: __________________________________________________ Patient Due Date: ____________________ Melody Haver counts is highly recommended in high risk pregnancies, but it is a good idea for every pregnant woman to do. Start counting fetal movements at 28 weeks of the pregnancy. Fetal movements increase after eating a full meal or eating or  drinking something sweet (the blood sugar is higher). It is also important to drink plenty of fluids (well hydrated) before doing the count. Lie on your left side because it helps with the  circulation or you can sit in a comfortable chair with your arms over your belly (abdomen) with no distractions around you. DOING THE COUNT  Try to do the count the same time of day each time you do it.   Mark the day and time, then see how long it takes for you to feel 10 movements (kicks, flutters, swishes, rolls). You should have at least 10 movements within 2 hours. You will most likely feel 10 movements in much less than 2 hours. If you do not, wait an hour and count again. After a couple of days you will see a pattern.   What you are looking for is a change in the pattern or not enough counts in 2 hours. Is it taking longer in time to reach 10 movements?  SEEK MEDICAL CARE IF:  You feel less than 10 counts in 2 hours. Tried twice.   No movement in one hour.   The pattern is changing or taking longer each day to reach 10 counts in 2 hours.   You feel the baby is not moving as it usually does.  Date: ____________ Movements: ____________ Start time: ____________ Jennifer Ryan time: ____________  Date: ____________ Movements: ____________ Start time: ____________ Jennifer Ryan time: ____________ Date: ____________ Movements: ____________ Start time: ____________ Jennifer Ryan time: ____________ Date: ____________ Movements: ____________ Start time: ____________ Jennifer Ryan time: ____________ Date: ____________ Movements: ____________ Start time: ____________ Jennifer Ryan time: ____________ Date: ____________ Movements: ____________ Start time: ____________ Jennifer Ryan time: ____________ Date: ____________ Movements: ____________ Start time: ____________ Jennifer Ryan time: ____________ Date: ____________ Movements: ____________ Start time: ____________ Jennifer Ryan time: ____________  Date: ____________ Movements: ____________ Start time: ____________ Jennifer Ryan time: ____________ Date: ____________ Movements: ____________ Start time: ____________ Jennifer Ryan time: ____________ Date: ____________ Movements: ____________ Start time: ____________  Jennifer Ryan time: ____________ Date: ____________ Movements: ____________ Start time: ____________ Jennifer Ryan time: ____________ Date: ____________ Movements: ____________ Start time: ____________ Jennifer Ryan time: ____________ Date: ____________ Movements: ____________ Start time: ____________ Jennifer Ryan time: ____________ Date: ____________ Movements: ____________ Start time: ____________ Jennifer Ryan time: ____________  Date: ____________ Movements: ____________ Start time: ____________ Jennifer Ryan time: ____________ Date: ____________ Movements: ____________ Start time: ____________ Jennifer Ryan time: ____________ Date: ____________ Movements: ____________ Start time: ____________ Jennifer Ryan time: ____________ Date: ____________ Movements: ____________ Start time: ____________ Jennifer Ryan time: ____________ Date: ____________ Movements: ____________ Start time: ____________ Jennifer Ryan time: ____________ Date: ____________ Movements: ____________ Start time: ____________ Jennifer Ryan time: ____________ Date: ____________ Movements: ____________ Start time: ____________ Jennifer Ryan time: ____________  Date: ____________ Movements: ____________ Start time: ____________ Jennifer Ryan time: ____________ Date: ____________ Movements: ____________ Start time: ____________ Jennifer Ryan time: ____________ Date: ____________ Movements: ____________ Start time: ____________ Jennifer Ryan time: ____________ Date: ____________ Movements: ____________ Start time: ____________ Jennifer Ryan time: ____________ Date: ____________ Movements: ____________ Start time: ____________ Jennifer Ryan time: ____________ Date: ____________ Movements: ____________ Start time: ____________ Jennifer Ryan time: ____________ Date: ____________ Movements: ____________ Start time: ____________ Jennifer Ryan time: ____________  Date: ____________ Movements: ____________ Start time: ____________ Jennifer Ryan time: ____________ Date: ____________ Movements: ____________ Start time: ____________ Jennifer Ryan time: ____________ Date: ____________  Movements: ____________ Start time: ____________ Jennifer Ryan time: ____________ Date: ____________ Movements: ____________ Start time: ____________ Jennifer Ryan time: ____________ Date: ____________ Movements: ____________ Start time: ____________ Jennifer Ryan time: ____________ Date: ____________ Movements: ____________ Start time: ____________ Jennifer Ryan time: ____________ Date: ____________ Movements: ____________ Start time: ____________ Jennifer Ryan time: ____________  Date: ____________ Movements: ____________ Start time:  ____________ Jennifer Ryan time: ____________ Date: ____________ Movements: ____________ Start time: ____________ Jennifer Ryan time: ____________ Date: ____________ Movements: ____________ Start time: ____________ Jennifer Ryan time: ____________ Date: ____________ Movements: ____________ Start time: ____________ Jennifer Ryan time: ____________ Date: ____________ Movements: ____________ Start time: ____________ Jennifer Ryan time: ____________ Date: ____________ Movements: ____________ Start time: ____________ Jennifer Ryan time: ____________ Date: ____________ Movements: ____________ Start time: ____________ Jennifer Ryan time: ____________  Date: ____________ Movements: ____________ Start time: ____________ Jennifer Ryan time: ____________ Date: ____________ Movements: ____________ Start time: ____________ Jennifer Ryan time: ____________ Date: ____________ Movements: ____________ Start time: ____________ Jennifer Ryan time: ____________ Date: ____________ Movements: ____________ Start time: ____________ Jennifer Ryan time: ____________ Date: ____________ Movements: ____________ Start time: ____________ Jennifer Ryan time: ____________ Date: ____________ Movements: ____________ Start time: ____________ Jennifer Ryan time: ____________ Date: ____________ Movements: ____________ Start time: ____________ Jennifer Ryan time: ____________  Date: ____________ Movements: ____________ Start time: ____________ Jennifer Ryan time: ____________ Date: ____________ Movements: ____________ Start time:  ____________ Jennifer Ryan time: ____________ Date: ____________ Movements: ____________ Start time: ____________ Jennifer Ryan time: ____________ Date: ____________ Movements: ____________ Start time: ____________ Jennifer Ryan time: ____________ Date: ____________ Movements: ____________ Start time: ____________ Jennifer Ryan time: ____________ Date: ____________ Movements: ____________ Start time: ____________ Jennifer Ryan time: ____________ Document Released: 02/16/2006 Document Revised: 01/06/2011 Document Reviewed: 08/19/2008 [employer] Patient Information 2012 [employer].

## 2011-10-08 NOTE — MAU Provider Note (Signed)
History   Jennifer Ryan is a 26 y.o. G1P0 female at [redacted]w[redacted]d who reports being [place] zoo earlier today when legs and lower back began hurting, and she began to feel pressure in lower abd/pelvis w/ occasional sharp generalized abdominal pains, and feeling like she was unable to walk. Husband had to carry her back to car.  Denies LOF, VB, urinary frequency, urgency, or dysuria. Reports +fm.  Next appt at [facility] 9/20.  CSN: [id number]  Arrival date and time: 10/08/11 1813   First Provider Initiated Contact with Patient 10/08/11 2023      Chief Complaint  Patient presents with  . Abdominal Pain   HPI  OB History    Grav Para Term Preterm Abortions TAB SAB Ect Mult Living   1         0      Past Medical History  Diagnosis Date  . No pertinent past medical history     Past Surgical History  Procedure Date  . Wisdom tooth extraction 2007    Family History  Problem Relation Age of Onset  . Prostate cancer Paternal Grandfather   . Brain cancer      pat great grandfather  . Heart attack Paternal Uncle   . Depression Maternal Aunt   . Diabetes      cousins   . Hyperlipidemia    . Hypertension Paternal Grandfather   . Hypertension Paternal Grandmother     History  Substance Use Topics  . Smoking status: Never Smoker   . Smokeless tobacco: Never Used  . Alcohol Use: No    Allergies:  Allergies  Allergen Reactions  . Penicillins Rash    Prescriptions prior to admission  Medication Sig Dispense Refill  . Prenatal Vit-Fe Fumarate-FA (PRENATAL MULTIVITAMIN) TABS Take 1 tablet by mouth at bedtime.        Review of Systems  Constitutional: Negative.   HENT: Negative.   Eyes: Negative.   Respiratory: Negative.   Cardiovascular: Negative.   Gastrointestinal: Positive for abdominal pain (lower pelvic pressure, generalized abdominal cramping). Negative for nausea, vomiting, diarrhea and constipation.  Genitourinary: Negative.   Musculoskeletal: Positive for back  pain (low back pain).  Skin: Negative.   Neurological: Dizziness: felt lightheaded earlier at zoo, denies now.  Endo/Heme/Allergies: Negative.   Psychiatric/Behavioral: Negative.    Physical Exam   Blood pressure 108/56, pulse 72, temperature 98 F (36.7 C), temperature source Oral, resp. rate 18, last menstrual period 05/05/2011.  Physical Exam  Constitutional: She is oriented to person, place, and time. She appears well-nourished.  HENT:  Head: Normocephalic.  Cardiovascular: Normal rate and regular rhythm.   Respiratory: Effort normal and breath sounds normal.  GI: Soft. There is no tenderness.  Genitourinary: Vagina normal and uterus normal.       Spec exam: cervix visually closed, small amount white non-odorous d/c present Wet prep & gc/ct obtained  SVE: cl/thick/firm, -2  Musculoskeletal: Normal range of motion.  Neurological: She is alert and oriented to person, place, and time. She has normal reflexes.  Skin: Skin is warm and dry.  Psychiatric: She has a normal mood and affect. Her behavior is normal. Judgment and thought content normal.   FHR: 135 via doppler UCs: none  MAU Course  Procedures  Spec exam w/ wet prep & gc/ct SVE 1 L LR bolus  Results for orders placed during the hospital encounter of 10/08/11 (from the past 24 hour(s))  URINALYSIS, ROUTINE W REFLEX MICROSCOPIC     Status:  Normal   Collection Time   10/08/11  6:24 PM      Component Value Range   Color, Urine YELLOW  YELLOW   APPearance CLEAR  CLEAR   Specific Gravity, Urine 1.025  1.005 - 1.030   pH 6.0  5.0 - 8.0   Glucose, UA NEGATIVE  NEGATIVE mg/dL   Hgb urine dipstick NEGATIVE  NEGATIVE   Bilirubin Urine NEGATIVE  NEGATIVE   Ketones, ur NEGATIVE  NEGATIVE mg/dL   Protein, ur NEGATIVE  NEGATIVE mg/dL   Urobilinogen, UA 0.2  0.0 - 1.0 mg/dL   Nitrite NEGATIVE  NEGATIVE   Leukocytes, UA NEGATIVE  NEGATIVE  WET PREP, GENITAL     Status: Abnormal   Collection Time   10/08/11  8:35 PM       Component Value Range   Yeast Wet Prep HPF POC NONE SEEN  NONE SEEN   Trich, Wet Prep NONE SEEN  NONE SEEN   Clue Cells Wet Prep HPF POC NONE SEEN  NONE SEEN   WBC, Wet Prep HPF POC FEW (*) NONE SEEN    Myrian, [place]  Home Medication Instructions [id number]   Printed on:10/08/11 2228  Medication Information                    Prenatal Vit-Fe Fumarate-FA (PRENATAL MULTIVITAMIN) TABS Take 1 tablet by mouth at bedtime.            Follow-up Information    Follow up with [facility]. Call in 2 days. (on Monday to see if they want you to be seen earlier than 9/20, otherwise keep that appointment.  Return to hospital  as needed or  if symptoms worsen)    Contact information:   [address], [address] [place] [address] [phone]        Feels some better after IVF  Reviewed pt w/ Dr. Jolayne Panther Assessment and Plan  A:  [redacted]w[redacted]d SIUP  +FHT via doppler  Abdominal pain during pregnancy- SVE: cl/thick/firm   P:  D/C home  Pelvic rest until appt on 9/20  Stay hydrated  Call [place] office on Monday to see if they want you to be seen before 9/20  Return to hospital prn    Marge Duncans 10/08/2011, 8:24 PM

## 2011-10-08 NOTE — MAU Note (Signed)
"  I went to the zoo today. I got lightheaded and found it difficult to walk after being there about 3.5 hours.  I started having really bad pain and pressure in my lower abd.  (+) FM.  No VB or LOF."

## 2011-10-10 ENCOUNTER — Encounter: Payer: Self-pay | Admitting: Obstetrics and Gynecology

## 2011-10-10 ENCOUNTER — Ambulatory Visit (INDEPENDENT_AMBULATORY_CARE_PROVIDER_SITE_OTHER): Payer: Medicaid Other | Admitting: Obstetrics and Gynecology

## 2011-10-10 DIAGNOSIS — Z3480 Encounter for supervision of other normal pregnancy, unspecified trimester: Secondary | ICD-10-CM

## 2011-10-10 DIAGNOSIS — R1084 Generalized abdominal pain: Secondary | ICD-10-CM

## 2011-10-10 NOTE — Progress Notes (Signed)
Follow up from MAU, patient is having pain on both sides of lower abdomen since Saturday.  She continues to hurt, she relaxed yesterday and woke up in pain this morning.

## 2011-10-10 NOTE — Patient Instructions (Signed)

## 2011-10-10 NOTE — Progress Notes (Signed)
MAU visit 9/7 for lower abd pain after prolonged walking at zoo. No UCs and VE was normal. Describes bilat groin discomfort, better at rest. Note to miss work today and get stool at work. Try abd tightening exercises.

## 2011-10-10 NOTE — MAU Provider Note (Signed)
Agree with above note.  Jonnathan Birman 10/10/2011 1:26 PM   

## 2011-10-20 ENCOUNTER — Encounter: Payer: Self-pay | Admitting: Advanced Practice Midwife

## 2011-10-27 ENCOUNTER — Ambulatory Visit (INDEPENDENT_AMBULATORY_CARE_PROVIDER_SITE_OTHER): Payer: Medicaid Other | Admitting: Obstetrics & Gynecology

## 2011-10-27 ENCOUNTER — Encounter: Payer: Self-pay | Admitting: Obstetrics & Gynecology

## 2011-10-27 VITALS — BP 114/65 | Temp 97.7°F | Wt 124.0 lb

## 2011-10-27 DIAGNOSIS — Z3490 Encounter for supervision of normal pregnancy, unspecified, unspecified trimester: Secondary | ICD-10-CM

## 2011-10-27 DIAGNOSIS — Z3480 Encounter for supervision of other normal pregnancy, unspecified trimester: Secondary | ICD-10-CM

## 2011-10-27 NOTE — Progress Notes (Signed)
Work in visit. She complains of several weeks of abdominal and back pain. She denies VB or ROM. I have shown her back exercises and recommended a chiropractor. I have given her PTL precautions and rec that she come back to the office any time that she is concerned. Good FM. She will need a glucola at her next visit.

## 2011-10-27 NOTE — Progress Notes (Signed)
p-79 Lower back pain x 1 week.

## 2011-11-03 ENCOUNTER — Other Ambulatory Visit (HOSPITAL_COMMUNITY): Payer: Self-pay | Admitting: Obstetrics and Gynecology

## 2011-11-03 DIAGNOSIS — Z141 Cystic fibrosis carrier: Secondary | ICD-10-CM

## 2011-11-04 ENCOUNTER — Encounter (HOSPITAL_COMMUNITY): Payer: Self-pay

## 2011-11-04 ENCOUNTER — Ambulatory Visit (HOSPITAL_COMMUNITY)
Admission: RE | Admit: 2011-11-04 | Discharge: 2011-11-04 | Disposition: A | Discharge status: Home / Self Care | Payer: Medicaid Other | Source: Ambulatory Visit | Attending: Obstetrics & Gynecology | Admitting: Obstetrics & Gynecology

## 2011-11-04 VITALS — BP 109/65 | HR 89 | Wt 127.0 lb

## 2011-11-04 DIAGNOSIS — Z141 Cystic fibrosis carrier: Secondary | ICD-10-CM

## 2011-11-04 DIAGNOSIS — O352XX0 Maternal care for (suspected) hereditary disease in fetus, not applicable or unspecified: Secondary | ICD-10-CM | POA: Insufficient documentation

## 2011-11-04 DIAGNOSIS — Z3490 Encounter for supervision of normal pregnancy, unspecified, unspecified trimester: Secondary | ICD-10-CM

## 2011-11-04 NOTE — Progress Notes (Signed)
Jennifer Ryan  was seen today for an ultrasound appointment.  See full report in [facility].  Alpha Gula, MD  Single IUP at 26 1/7 weeks Normal interval anatomy - normal fetal profile noted on today's study; anatomic survey now complete Growth is appropriate (59th %tile) Normal amniotic fluid volume  Recommend follow up as clinically indicated

## 2011-11-18 ENCOUNTER — Ambulatory Visit (INDEPENDENT_AMBULATORY_CARE_PROVIDER_SITE_OTHER): Payer: Medicaid Other | Admitting: Advanced Practice Midwife

## 2011-11-18 ENCOUNTER — Encounter: Payer: Self-pay | Admitting: Advanced Practice Midwife

## 2011-11-18 VITALS — BP 129/68 | Temp 98.4°F | Wt 127.0 lb

## 2011-11-18 DIAGNOSIS — Z3480 Encounter for supervision of other normal pregnancy, unspecified trimester: Secondary | ICD-10-CM

## 2011-11-18 DIAGNOSIS — Z3490 Encounter for supervision of normal pregnancy, unspecified, unspecified trimester: Secondary | ICD-10-CM

## 2011-11-18 NOTE — Progress Notes (Signed)
28 week labs done. No concerns.

## 2011-11-18 NOTE — Progress Notes (Signed)
[id number] 

## 2011-11-19 LAB — CBC
HCT: 34.3 % — ABNORMAL LOW (ref 36.0–46.0)
Hemoglobin: 11.4 g/dL — ABNORMAL LOW (ref 12.0–15.0)
RDW: 12.7 % (ref 11.5–15.5)
WBC: 8.6 10*3/uL (ref 4.0–10.5)

## 2011-11-19 LAB — RPR

## 2011-11-19 LAB — GLUCOSE TOLERANCE, 1 HOUR (50G) W/O FASTING: Glucose, 1 Hour GTT: 96 mg/dL (ref 70–140)

## 2011-12-02 ENCOUNTER — Ambulatory Visit (INDEPENDENT_AMBULATORY_CARE_PROVIDER_SITE_OTHER): Payer: Medicaid Other | Admitting: Advanced Practice Midwife

## 2011-12-02 VITALS — BP 127/71 | Temp 97.1°F | Wt 130.0 lb

## 2011-12-02 DIAGNOSIS — Z3490 Encounter for supervision of normal pregnancy, unspecified, unspecified trimester: Secondary | ICD-10-CM

## 2011-12-02 DIAGNOSIS — Z3480 Encounter for supervision of other normal pregnancy, unspecified trimester: Secondary | ICD-10-CM

## 2011-12-02 NOTE — Progress Notes (Signed)
[id number] 

## 2011-12-02 NOTE — Patient Instructions (Signed)
Pregnancy - Third Trimester  The third trimester of pregnancy (the last 3 months) is a period of the most rapid growth for you and your baby. The baby approaches a length of 20 inches and a weight of 6 to 10 pounds. The baby is adding on fat and getting ready for life outside your body. While inside, babies have periods of sleeping and waking, suck their thumbs, and hiccups. You can often feel small contractions of the uterus. This is false labor. It is also called Braxton-Hicks contractions. This is like a practice for labor. The usual problems in this stage of pregnancy include more difficulty breathing, swelling of the hands and feet from water retention, and having to urinate more often because of the uterus and baby pressing on your bladder.   PRENATAL EXAMS  · Blood work may continue to be done during prenatal exams. These tests are done to check on your health and the probable health of your baby. Blood work is used to follow your blood levels (hemoglobin). Anemia (low hemoglobin) is common during pregnancy. Iron and vitamins are given to help prevent this. You may also continue to be checked for diabetes. Some of the past blood tests may be done again.  · The size of the uterus is measured during each visit. This makes sure your baby is growing properly according to your pregnancy dates.  · Your blood pressure is checked every prenatal visit. This is to make sure you are not getting toxemia.  · Your urine is checked every prenatal visit for infection, diabetes and protein.  · Your weight is checked at each visit. This is done to make sure gains are happening at the suggested rate and that you and your baby are growing normally.  · Sometimes, an ultrasound is performed to confirm the position and the proper growth and development of the baby. This is a test done that bounces harmless sound waves off the baby so your caregiver can more accurately determine due dates.  · Discuss the type of pain medication and  anesthesia you will have during your labor and delivery.  · Discuss the possibility and anesthesia if a Cesarean Section might be necessary.  · Inform your caregiver if there is any mental or physical violence at home.  Sometimes, a specialized non-stress test, contraction stress test and biophysical profile are done to make sure the baby is not having a problem. Checking the amniotic fluid surrounding the baby is called an amniocentesis. The amniotic fluid is removed by sticking a needle into the belly (abdomen). This is sometimes done near the end of pregnancy if an early delivery is required. In this case, it is done to help make sure the baby's lungs are mature enough for the baby to live outside of the womb. If the lungs are not mature and it is unsafe to deliver the baby, an injection of cortisone medication is given to the mother 1 to 2 days before the delivery. This helps the baby's lungs mature and makes it safer to deliver the baby.  CHANGES OCCURING IN THE THIRD TRIMESTER OF PREGNANCY  Your body goes through many changes during pregnancy. They vary from person to person. Talk to your caregiver about changes you notice and are concerned about.  · During the last trimester, you have probably had an increase in your appetite. It is normal to have cravings for certain foods. This varies from person to person and pregnancy to pregnancy.  · You may begin to   get stretch marks on your hips, abdomen, and breasts. These are normal changes in the body during pregnancy. There are no exercises or medications to take which prevent this change.  · Constipation may be treated with a stool softener or adding bulk to your diet. Drinking lots of fluids, fiber in vegetables, fruits, and whole grains are helpful.  · Exercising is also helpful. If you have been very active up until your pregnancy, most of these activities can be continued during your pregnancy. If you have been less active, it is helpful to start an exercise  program such as walking. Consult your caregiver before starting exercise programs.  · Avoid all smoking, alcohol, un-prescribed drugs, herbs and "street drugs" during your pregnancy. These chemicals affect the formation and growth of the baby. Avoid chemicals throughout the pregnancy to ensure the delivery of a healthy infant.  · Backache, varicose veins and hemorrhoids may develop or get worse.  · You will tire more easily in the third trimester, which is normal.  · The baby's movements may be stronger and more often.  · You may become short of breath easily.  · Your belly button may stick out.  · A yellow discharge may leak from your breasts called colostrum.  · You may have a bloody mucus discharge. This usually occurs a few days to a week before labor begins.  HOME CARE INSTRUCTIONS   · Keep your caregiver's appointments. Follow your caregiver's instructions regarding medication use, exercise, and diet.  · During pregnancy, you are providing food for you and your baby. Continue to eat regular, well-balanced meals. Choose foods such as meat, fish, milk and other low fat dairy products, vegetables, fruits, and whole-grain breads and cereals. Your caregiver will tell you of the ideal weight gain.  · A physical sexual relationship may be continued throughout pregnancy if there are no other problems such as early (premature) leaking of amniotic fluid from the membranes, vaginal bleeding, or belly (abdominal) pain.  · Exercise regularly if there are no restrictions. Check with your caregiver if you are unsure of the safety of your exercises. Greater weight gain will occur in the last 2 trimesters of pregnancy. Exercising helps:  · Control your weight.  · Get you in shape for labor and delivery.  · You lose weight after you deliver.  · Rest a lot with legs elevated, or as needed for leg cramps or low back pain.  · Wear a good support or jogging bra for breast tenderness during pregnancy. This may help if worn during  sleep. Pads or tissues may be used in the bra if you are leaking colostrum.  · Do not use hot tubs, steam rooms, or saunas.  · Wear your seat belt when driving. This protects you and your baby if you are in an accident.  · Avoid raw meat, cat litter boxes and soil used by cats. These carry germs that can cause birth defects in the baby.  · It is easier to loose urine during pregnancy. Tightening up and strengthening the pelvic muscles will help with this problem. You can practice stopping your urination while you are going to the bathroom. These are the same muscles you need to strengthen. It is also the muscles you would use if you were trying to stop from passing gas. You can practice tightening these muscles up 10 times a set and repeating this about 3 times per day. Once you know what muscles to tighten up, do not perform these   exercises during urination. It is more likely to cause an infection by backing up the urine.  · Ask for help if you have financial, counseling or nutritional needs during pregnancy. Your caregiver will be able to offer counseling for these needs as well as refer you for other special needs.  · Make a list of emergency phone numbers and have them available.  · Plan on getting help from family or friends when you go home from the hospital.  · Make a trial run to the hospital.  · Take prenatal classes with the father to understand, practice and ask questions about the labor and delivery.  · Prepare the baby's room/nursery.  · Do not travel out of the city unless it is absolutely necessary and with the advice of your caregiver.  · Wear only low or no heal shoes to have better balance and prevent falling.  MEDICATIONS AND DRUG USE IN PREGNANCY  · Take prenatal vitamins as directed. The vitamin should contain 1 milligram of folic acid. Keep all vitamins out of reach of children. Only a couple vitamins or tablets containing iron may be fatal to a baby or young child when ingested.  · Avoid use  of all medications, including herbs, over-the-counter medications, not prescribed or suggested by your caregiver. Only take over-the-counter or prescription medicines for pain, discomfort, or fever as directed by your caregiver. Do not use aspirin, ibuprofen (Motrin®, Advil®, Nuprin®) or naproxen (Aleve®) unless OK'd by your caregiver.  · Let your caregiver also know about herbs you may be using.  · Alcohol is related to a number of birth defects. This includes fetal alcohol syndrome. All alcohol, in any form, should be avoided completely. Smoking will cause low birth rate and premature babies.  · Street/illegal drugs are very harmful to the baby. They are absolutely forbidden. A baby born to an addicted mother will be addicted at birth. The baby will go through the same withdrawal an adult does.  SEEK MEDICAL CARE IF:  You have any concerns or worries during your pregnancy. It is better to call with your questions if you feel they cannot wait, rather than worry about them.  DECISIONS ABOUT CIRCUMCISION  You may or may not know the sex of your baby. If you know your baby is a boy, it may be time to think about circumcision. Circumcision is the removal of the foreskin of the penis. This is the skin that covers the sensitive end of the penis. There is no proven medical need for this. Often this decision is made on what is popular at the time or based upon religious beliefs and social issues. You can discuss these issues with your caregiver or pediatrician.  SEEK IMMEDIATE MEDICAL CARE IF:   · An unexplained oral temperature above 102° F (38.9° C) develops, or as your caregiver suggests.  · You have leaking of fluid from the vagina (birth canal). If leaking membranes are suspected, take your temperature and tell your caregiver of this when you call.  · There is vaginal spotting, bleeding or passing clots. Tell your caregiver of the amount and how many pads are used.  · You develop a bad smelling vaginal discharge with  a change in the color from clear to white.  · You develop vomiting that lasts more than 24 hours.  · You develop chills or fever.  · You develop shortness of breath.  · You develop burning on urination.  · You loose more than 2 pounds of weight   or gain more than 2 pounds of weight or as suggested by your caregiver.  · You notice sudden swelling of your face, hands, and feet or legs.  · You develop belly (abdominal) pain. Round ligament discomfort is a common non-cancerous (benign) cause of abdominal pain in pregnancy. Your caregiver still must evaluate you.  · You develop a severe headache that does not go away.  · You develop visual problems, blurred or double vision.  · If you have not felt your baby move for more than 1 hour. If you think the baby is not moving as much as usual, eat something with sugar in it and lie down on your left side for an hour. The baby should move at least 4 to 5 times per hour. Call right away if your baby moves less than that.  · You fall, are in a car accident or any kind of trauma.  · There is mental or physical violence at home.  Document Released: 01/11/2001 Document Revised: 04/11/2011 Document Reviewed: 07/16/2008  [employer]® Patient Information ©2013 [employer].

## 2011-12-02 NOTE — Progress Notes (Signed)
Reviewed 28 week labs. Circ info given.

## 2011-12-15 ENCOUNTER — Encounter: Payer: Self-pay | Admitting: Obstetrics & Gynecology

## 2011-12-15 ENCOUNTER — Ambulatory Visit (INDEPENDENT_AMBULATORY_CARE_PROVIDER_SITE_OTHER): Payer: Medicaid Other | Admitting: Obstetrics & Gynecology

## 2011-12-15 VITALS — BP 119/69 | Temp 97.8°F | Wt 131.0 lb

## 2011-12-15 DIAGNOSIS — Z3480 Encounter for supervision of other normal pregnancy, unspecified trimester: Secondary | ICD-10-CM

## 2011-12-15 DIAGNOSIS — Z3490 Encounter for supervision of normal pregnancy, unspecified, unspecified trimester: Secondary | ICD-10-CM

## 2011-12-15 NOTE — Progress Notes (Signed)
Routine visit. She still has irregular CTXs. Cervix closed. PTL precautions given. No other issues.

## 2011-12-15 NOTE — Progress Notes (Signed)
[id number] 

## 2011-12-28 ENCOUNTER — Encounter: Payer: Self-pay | Admitting: Obstetrics & Gynecology

## 2011-12-28 ENCOUNTER — Ambulatory Visit (INDEPENDENT_AMBULATORY_CARE_PROVIDER_SITE_OTHER): Payer: Medicaid Other | Admitting: Obstetrics & Gynecology

## 2011-12-28 VITALS — BP 126/84 | Temp 97.4°F | Wt 135.0 lb

## 2011-12-28 DIAGNOSIS — J029 Acute pharyngitis, unspecified: Secondary | ICD-10-CM

## 2011-12-28 DIAGNOSIS — Z3480 Encounter for supervision of other normal pregnancy, unspecified trimester: Secondary | ICD-10-CM

## 2011-12-28 DIAGNOSIS — Z3490 Encounter for supervision of normal pregnancy, unspecified, unspecified trimester: Secondary | ICD-10-CM

## 2011-12-28 NOTE — Progress Notes (Signed)
Routine visit. She mentioned an increase in pelvic discomfort and would like her cervix checked. She reports good FM and denies ROM or VB. Her throat appears normal. I have recommended OTC meds for a cold. TMs normal. I sent a throat culture. PTL precautions.

## 2011-12-28 NOTE — Progress Notes (Signed)
p-97  ST and ear pain

## 2011-12-28 NOTE — Addendum Note (Signed)
Addended by: Granville Lewis on: 12/28/2011 12:58 PM   Modules accepted: Orders

## 2011-12-30 LAB — CULTURE, GROUP A STREP: Organism ID, Bacteria: NORMAL

## 2012-01-12 ENCOUNTER — Ambulatory Visit (INDEPENDENT_AMBULATORY_CARE_PROVIDER_SITE_OTHER): Payer: Medicaid Other | Admitting: Obstetrics & Gynecology

## 2012-01-12 VITALS — BP 119/73 | Temp 96.7°F | Wt 134.0 lb

## 2012-01-12 DIAGNOSIS — Z3400 Encounter for supervision of normal first pregnancy, unspecified trimester: Secondary | ICD-10-CM

## 2012-01-12 NOTE — Progress Notes (Signed)
p-82  36 week cultures

## 2012-01-12 NOTE — Progress Notes (Signed)
No problems.  GC/Chlam x2 this pregnancy.  Pt refuses another culture.  GBS today (PCN allergy).

## 2012-01-16 ENCOUNTER — Encounter: Payer: Self-pay | Admitting: Obstetrics & Gynecology

## 2012-01-16 LAB — OB RESULTS CONSOLE GBS: GBS: POSITIVE

## 2012-01-16 LAB — CULTURE, BETA STREP (GROUP B ONLY)

## 2012-01-20 ENCOUNTER — Encounter: Payer: Self-pay | Admitting: Advanced Practice Midwife

## 2012-01-20 ENCOUNTER — Ambulatory Visit (INDEPENDENT_AMBULATORY_CARE_PROVIDER_SITE_OTHER): Payer: Medicaid Other | Admitting: Advanced Practice Midwife

## 2012-01-20 ENCOUNTER — Encounter: Payer: Self-pay | Admitting: *Deleted

## 2012-01-20 VITALS — BP 142/81 | Wt 137.0 lb

## 2012-01-20 DIAGNOSIS — Z3480 Encounter for supervision of other normal pregnancy, unspecified trimester: Secondary | ICD-10-CM

## 2012-01-20 DIAGNOSIS — IMO0002 Reserved for concepts with insufficient information to code with codable children: Secondary | ICD-10-CM

## 2012-01-20 DIAGNOSIS — O10019 Pre-existing essential hypertension complicating pregnancy, unspecified trimester: Secondary | ICD-10-CM

## 2012-01-20 DIAGNOSIS — Z3490 Encounter for supervision of normal pregnancy, unspecified, unspecified trimester: Secondary | ICD-10-CM

## 2012-01-20 LAB — CBC
HCT: 32.7 % — ABNORMAL LOW (ref 36.0–46.0)
MCH: 28.6 pg (ref 26.0–34.0)
MCHC: 33.9 g/dL (ref 30.0–36.0)
RDW: 13.5 % (ref 11.5–15.5)

## 2012-01-20 NOTE — Progress Notes (Signed)
[id number] 

## 2012-01-20 NOTE — Progress Notes (Signed)
C/O increased BLE edema, +2 in feet and legs below knees. Reports intermittent headaches, sometimes relieved with tylenol, no headache today. No vision change or abd pain. Good fetal movement. Slightly elevated BP today upon arrival, normal upon recheck (118/74). PIH labs drawn, NST reactive. Return Monday for BP check, note given to stay out of work today. Rev'd PIH precautions.

## 2012-01-20 NOTE — Progress Notes (Signed)
BP recheck 118/74  P-103

## 2012-01-21 LAB — COMPREHENSIVE METABOLIC PANEL
AST: 18 U/L (ref 0–37)
Albumin: 3.2 g/dL — ABNORMAL LOW (ref 3.5–5.2)
Alkaline Phosphatase: 136 U/L — ABNORMAL HIGH (ref 39–117)
BUN: 11 mg/dL (ref 6–23)
Creat: 0.55 mg/dL (ref 0.50–1.10)
Potassium: 4.3 mEq/L (ref 3.5–5.3)
Total Bilirubin: 0.3 mg/dL (ref 0.3–1.2)

## 2012-01-21 LAB — MICROALBUMIN / CREATININE URINE RATIO
Creatinine, Urine: 103.1 mg/dL
Microalb Creat Ratio: 11.3 mg/g (ref 0.0–30.0)

## 2012-01-23 ENCOUNTER — Ambulatory Visit (INDEPENDENT_AMBULATORY_CARE_PROVIDER_SITE_OTHER): Payer: Medicaid Other | Admitting: *Deleted

## 2012-01-23 VITALS — BP 123/62 | HR 68 | Resp 17 | Wt 137.0 lb

## 2012-01-23 DIAGNOSIS — Z3400 Encounter for supervision of normal first pregnancy, unspecified trimester: Secondary | ICD-10-CM

## 2012-01-23 NOTE — Progress Notes (Signed)
Pt here for BP check only.  PIH labs normal and protein today is trace only.

## 2012-01-27 ENCOUNTER — Ambulatory Visit (INDEPENDENT_AMBULATORY_CARE_PROVIDER_SITE_OTHER): Payer: Medicaid Other | Admitting: Family

## 2012-01-27 VITALS — BP 136/83 | Temp 97.5°F | Wt 138.0 lb

## 2012-01-27 DIAGNOSIS — O139 Gestational [pregnancy-induced] hypertension without significant proteinuria, unspecified trimester: Secondary | ICD-10-CM

## 2012-01-27 DIAGNOSIS — Z3480 Encounter for supervision of other normal pregnancy, unspecified trimester: Secondary | ICD-10-CM

## 2012-01-27 NOTE — Progress Notes (Signed)
p-85 

## 2012-01-27 NOTE — Progress Notes (Signed)
No questions or concerns; labor precautions given; discussed GBS + results and need for antibiotic in labor.

## 2012-02-01 NOTE — L&D Delivery Note (Signed)
Delivery Note At 7:01 AM a viable female was delivered via Vaginal, Spontaneous Delivery (Presentation: ; Occiput Anterior).  APGAR: 5, 9; weight 5 lb 9 oz (2523 g).   Placenta status: Delivered Intact, Spontaneous.  Cord: 3 vessels with the following complications: Nuchal Cord Reduced.  Cord pH: N/A  Anesthesia: None  Episiotomy: None Lacerations: None Suture Repair: none Est. Blood Loss (mL): 250  Mom to postpartum.  Baby to nursery-stable.  Gildardo Cranker 02/15/2012, 7:58 AM

## 2012-02-03 ENCOUNTER — Ambulatory Visit (INDEPENDENT_AMBULATORY_CARE_PROVIDER_SITE_OTHER): Payer: Medicaid Other | Admitting: Advanced Practice Midwife

## 2012-02-03 VITALS — BP 141/89 | Wt 136.0 lb

## 2012-02-03 DIAGNOSIS — O09899 Supervision of other high risk pregnancies, unspecified trimester: Secondary | ICD-10-CM

## 2012-02-03 DIAGNOSIS — Z22330 Carrier of Group B streptococcus: Secondary | ICD-10-CM

## 2012-02-03 DIAGNOSIS — O99820 Streptococcus B carrier state complicating pregnancy: Secondary | ICD-10-CM | POA: Insufficient documentation

## 2012-02-03 DIAGNOSIS — O139 Gestational [pregnancy-induced] hypertension without significant proteinuria, unspecified trimester: Secondary | ICD-10-CM | POA: Insufficient documentation

## 2012-02-03 NOTE — Progress Notes (Signed)
p-88  BP recheck 140/80

## 2012-02-03 NOTE — Patient Instructions (Addendum)
Call [phone] for results.  Hypertension During Pregnancy Hypertension is also called high blood pressure. It can occur at any time in life and during pregnancy. When you have hypertension, there is extra pressure inside your blood vessels that carry blood from the heart to the rest of your body (arteries). Hypertension during pregnancy can cause problems for you and your baby. Your baby might not weigh as much as it should at birth or might be born early (premature). Very bad cases of hypertension during pregnancy can be life-threatening.  There are different types of hypertension during pregnancy.   Chronic hypertension. This happens when a woman has hypertension before pregnancy and it continues during pregnancy.  Gestational hypertension. This is when hypertension develops during pregnancy.  Preeclampsia or toxemia of pregnancy. This is a very serious type of hypertension that develops only during pregnancy. It is a disease that affects the whole body (systemic) and can be very dangerous for both mother and baby.  Gestational hypertension and preeclampsia usually go away after your baby is born. Blood pressure generally stabilizes within 6 weeks. Women who have hypertension during pregnancy have a greater chance of developing hypertension later in life or with future pregnancies. UNDERSTANDING BLOOD PRESSURE Blood pressure moves blood in your body. Sometimes, the force that moves the blood becomes too strong.  A blood pressure reading is given in 2 numbers and looks like a fraction.  The top number is called the systolic pressure. When your heart beats, it forces more blood to flow through the arteries. Pressure inside the arteries goes up.  The bottom number is the diastolic pressure. Pressure goes down between beats. That is when the heart is resting.  You may have hypertension if:  Your systolic blood pressure is above 140.  Your diastolic pressure is above 90. RISK  FACTORS Some factors make you more likely to develop hypertension during pregnancy. Risk factors include:  Having hypertension before pregnancy.  Having hypertension during a previous pregnancy.  Being overweight.  Being older than 40.  Being pregnant with more than 1 baby (multiples).  Having diabetes or kidney problems. SYMPTOMS Chronic and gestational hypertension may not cause symptoms. Preeclampsia has symptoms, which may include:  Increased protein in your urine. Your caregiver will check for this at every prenatal visit.  Swelling of your hands and face.  Rapid weight gain.  Headaches.  Visual changes.  Being bothered by light.  Abdominal pain, especially in the right upper area.  Chest pain.  Shortness of breath.  Increased reflexes.  Seizures. Seizures occur with a more severe form of preeclampsia, called eclampsia. DIAGNOSIS   You may be diagnosed with hypertension during pregnancy during a regular prenatal exam. At each visit, tests may include:  Blood pressure checks.  A urine test to check for protein in your urine.  The type of hypertension you are diagnosed with depends on when you developed it. It also depends on your specific blood pressure reading.  Developing hypertension before 20 weeks of pregnancy is consistent with chronic hypertension.  Developing hypertension after 20 weeks of pregnancy is consistent with gestational hypertension.  Hypertension with increased urinary protein is diagnosed as preeclampsia.  Blood pressure measurements that stay above 160 systolic or 110 diastolic are a sign of severe preeclampsia. TREATMENT Treatment for hypertension during pregnancy varies. Treatment depends on the type of hypertension and how serious it is.  If you take medicine for chronic hypertension, you may need to switch medicines.  Drugs called ACE inhibitors should  not be taken during pregnancy.  Low-dose aspirin may be suggested for women  who have risk factors for preeclampsia.  If you have gestational hypertension, you may need to take a blood pressure medicine that is safe during pregnancy. Your caregiver will recommend the appropriate medicine.  If you have severe preeclampsia, you may need to be in the hospital. Caregivers will watch you and the baby very closely. You also may need to take medicine (magnesium sulfate) to prevent seizures and lower blood pressure.  Sometimes an early delivery is needed. This may be the case if the condition worsens. It would be done to protect you and the baby. The only cure for preeclampsia is delivery. HOME CARE INSTRUCTIONS  Schedule and keep all of your regular prenatal care.  Follow your caregiver's instructions for taking medicines. Tell your caregiver about all medicines you take. This includes over-the-counter medicines.  Eat as little salt as possible.  Get regular exercise.  Do not drink alcohol.  Do not use tobacco products.  Do not drink products with caffeine.  Lie on your left side when resting.  Tell your doctor if you have any preeclampsia symptoms. SEEK IMMEDIATE MEDICAL CARE IF:  You have severe abdominal pain.  You have sudden swelling in the hands, ankles, or face.  You gain 4 pounds (1.8 kg) or more in 1 week.  You vomit repeatedly.  You have vaginal bleeding.  You do not feel the baby moving as much.  You have a headache.  You have blurred or double vision.  You have muscle twitching or spasms.  You have shortness of breath.  You have blue fingernails and lips.  You have blood in your urine. MAKE SURE YOU:  Understand these instructions.  Will watch your condition.  Will get help right away if you are not doing well. Document Released: 10/05/2010 Document Revised: 04/11/2011 Document Reviewed: 10/05/2010 [facility] Patient Information 2013 [place], [place].  Fetal Movement Counts Patient Name:  __________________________________________________ Patient Due Date: ____________________ Melody Haver counts is highly recommended in high risk pregnancies, but it is a good idea for every pregnant woman to do. Start counting fetal movements at 28 weeks of the pregnancy. Fetal movements increase after eating a full meal or eating or drinking something sweet (the blood sugar is higher). It is also important to drink plenty of fluids (well hydrated) before doing the count. Lie on your left side because it helps with the circulation or you can sit in a comfortable chair with your arms over your belly (abdomen) with no distractions around you. DOING THE COUNT  Try to do the count the same time of day each time you do it.  Mark the day and time, then see how long it takes for you to feel 10 movements (kicks, flutters, swishes, rolls). You should have at least 10 movements within 2 hours. You will most likely feel 10 movements in much less than 2 hours. If you do not, wait an hour and count again. After a couple of days you will see a pattern.  What you are looking for is a change in the pattern or not enough counts in 2 hours. Is it taking longer in time to reach 10 movements? SEEK MEDICAL CARE IF:  You feel less than 10 counts in 2 hours. Tried twice.  No movement in one hour.  The pattern is changing or taking longer each day to reach 10 counts in 2 hours.  You feel the baby is not moving as it  usually does. Date: ____________ Movements: ____________ Start time: ____________ Jennifer Ryan time: ____________  Date: ____________ Movements: ____________ Start time: ____________ Jennifer Ryan time: ____________ Date: ____________ Movements: ____________ Start time: ____________ Jennifer Ryan time: ____________ Date: ____________ Movements: ____________ Start time: ____________ Jennifer Ryan time: ____________ Date: ____________ Movements: ____________ Start time: ____________ Jennifer Ryan time: ____________ Date: ____________ Movements:  ____________ Start time: ____________ Jennifer Ryan time: ____________ Date: ____________ Movements: ____________ Start time: ____________ Jennifer Ryan time: ____________ Date: ____________ Movements: ____________ Start time: ____________ Jennifer Ryan time: ____________  Date: ____________ Movements: ____________ Start time: ____________ Jennifer Ryan time: ____________ Date: ____________ Movements: ____________ Start time: ____________ Jennifer Ryan time: ____________ Date: ____________ Movements: ____________ Start time: ____________ Jennifer Ryan time: ____________ Date: ____________ Movements: ____________ Start time: ____________ Jennifer Ryan time: ____________ Date: ____________ Movements: ____________ Start time: ____________ Jennifer Ryan time: ____________ Date: ____________ Movements: ____________ Start time: ____________ Jennifer Ryan time: ____________ Date: ____________ Movements: ____________ Start time: ____________ Jennifer Ryan time: ____________  Date: ____________ Movements: ____________ Start time: ____________ Jennifer Ryan time: ____________ Date: ____________ Movements: ____________ Start time: ____________ Jennifer Ryan time: ____________ Date: ____________ Movements: ____________ Start time: ____________ Jennifer Ryan time: ____________ Date: ____________ Movements: ____________ Start time: ____________ Jennifer Ryan time: ____________ Date: ____________ Movements: ____________ Start time: ____________ Jennifer Ryan time: ____________ Date: ____________ Movements: ____________ Start time: ____________ Jennifer Ryan time: ____________ Date: ____________ Movements: ____________ Start time: ____________ Jennifer Ryan time: ____________  Date: ____________ Movements: ____________ Start time: ____________ Jennifer Ryan time: ____________ Date: ____________ Movements: ____________ Start time: ____________ Jennifer Ryan time: ____________ Date: ____________ Movements: ____________ Start time: ____________ Jennifer Ryan time: ____________ Date: ____________ Movements: ____________ Start time: ____________ Jennifer Ryan  time: ____________ Date: ____________ Movements: ____________ Start time: ____________ Jennifer Ryan time: ____________ Date: ____________ Movements: ____________ Start time: ____________ Jennifer Ryan time: ____________ Date: ____________ Movements: ____________ Start time: ____________ Jennifer Ryan time: ____________  Date: ____________ Movements: ____________ Start time: ____________ Jennifer Ryan time: ____________ Date: ____________ Movements: ____________ Start time: ____________ Jennifer Ryan time: ____________ Date: ____________ Movements: ____________ Start time: ____________ Jennifer Ryan time: ____________ Date: ____________ Movements: ____________ Start time: ____________ Jennifer Ryan time: ____________ Date: ____________ Movements: ____________ Start time: ____________ Jennifer Ryan time: ____________ Date: ____________ Movements: ____________ Start time: ____________ Jennifer Ryan time: ____________ Date: ____________ Movements: ____________ Start time: ____________ Jennifer Ryan time: ____________  Date: ____________ Movements: ____________ Start time: ____________ Jennifer Ryan time: ____________ Date: ____________ Movements: ____________ Start time: ____________ Jennifer Ryan time: ____________ Date: ____________ Movements: ____________ Start time: ____________ Jennifer Ryan time: ____________ Date: ____________ Movements: ____________ Start time: ____________ Jennifer Ryan time: ____________ Date: ____________ Movements: ____________ Start time: ____________ Jennifer Ryan time: ____________ Date: ____________ Movements: ____________ Start time: ____________ Jennifer Ryan time: ____________ Date: ____________ Movements: ____________ Start time: ____________ Jennifer Ryan time: ____________  Date: ____________ Movements: ____________ Start time: ____________ Jennifer Ryan time: ____________ Date: ____________ Movements: ____________ Start time: ____________ Jennifer Ryan time: ____________ Date: ____________ Movements: ____________ Start time: ____________ Jennifer Ryan time: ____________ Date: ____________  Movements: ____________ Start time: ____________ Jennifer Ryan time: ____________ Date: ____________ Movements: ____________ Start time: ____________ Jennifer Ryan time: ____________ Date: ____________ Movements: ____________ Start time: ____________ Jennifer Ryan time: ____________ Date: ____________ Movements: ____________ Start time: ____________ Jennifer Ryan time: ____________  Date: ____________ Movements: ____________ Start time: ____________ Jennifer Ryan time: ____________ Date: ____________ Movements: ____________ Start time: ____________ Jennifer Ryan time: ____________ Date: ____________ Movements: ____________ Start time: ____________ Jennifer Ryan time: ____________ Date: ____________ Movements: ____________ Start time: ____________ Jennifer Ryan time: ____________ Date: ____________ Movements: ____________ Start time: ____________ Jennifer Ryan time: ____________ Date: ____________ Movements: ____________ Start time: ____________ Jennifer Ryan time: ____________ Document Released: 02/16/2006 Document Revised: 04/11/2011 Document Reviewed: 08/19/2008 [employer] Patient Information 2013 [place].

## 2012-02-03 NOTE — Progress Notes (Signed)
No PIH Sx. Precautions reviewed. CBC, CMET. PC ratio Stat. May call MAU for results tonight.

## 2012-02-04 LAB — CBC
Hemoglobin: 11.6 g/dL — ABNORMAL LOW (ref 12.0–15.0)
Platelets: 253 10*3/uL (ref 150–400)
RBC: 4.23 MIL/uL (ref 3.87–5.11)
WBC: 9.2 10*3/uL (ref 4.0–10.5)

## 2012-02-04 LAB — COMPREHENSIVE METABOLIC PANEL
ALT: 13 U/L (ref 0–35)
Albumin: 3.5 g/dL (ref 3.5–5.2)
CO2: 22 mEq/L (ref 19–32)
Chloride: 105 mEq/L (ref 96–112)
Glucose, Bld: 80 mg/dL (ref 70–99)
Potassium: 4.4 mEq/L (ref 3.5–5.3)
Sodium: 140 mEq/L (ref 135–145)
Total Protein: 5.7 g/dL — ABNORMAL LOW (ref 6.0–8.3)

## 2012-02-04 LAB — PROTEIN / CREATININE RATIO, URINE
Creatinine, Urine: 119.9 mg/dL
Total Protein, Urine: 16 mg/dL

## 2012-02-05 ENCOUNTER — Telehealth: Payer: Self-pay | Admitting: Advanced Practice Midwife

## 2012-02-05 NOTE — Telephone Encounter (Signed)
Normal PIH labs and PC ratio. F/U AS tomorrow for BP check.

## 2012-02-06 ENCOUNTER — Ambulatory Visit (INDEPENDENT_AMBULATORY_CARE_PROVIDER_SITE_OTHER): Payer: Medicaid Other | Admitting: Advanced Practice Midwife

## 2012-02-06 VITALS — BP 120/76 | Wt 138.0 lb

## 2012-02-06 DIAGNOSIS — Z22330 Carrier of Group B streptococcus: Secondary | ICD-10-CM

## 2012-02-06 DIAGNOSIS — O99820 Streptococcus B carrier state complicating pregnancy: Secondary | ICD-10-CM

## 2012-02-06 DIAGNOSIS — O09899 Supervision of other high risk pregnancies, unspecified trimester: Secondary | ICD-10-CM

## 2012-02-06 NOTE — Progress Notes (Signed)
p-71  BP check only

## 2012-02-10 ENCOUNTER — Ambulatory Visit (INDEPENDENT_AMBULATORY_CARE_PROVIDER_SITE_OTHER): Payer: Medicaid Other | Admitting: Family

## 2012-02-10 VITALS — BP 130/76 | Wt 137.0 lb

## 2012-02-10 DIAGNOSIS — O479 False labor, unspecified: Secondary | ICD-10-CM

## 2012-02-10 DIAGNOSIS — Z3480 Encounter for supervision of other normal pregnancy, unspecified trimester: Secondary | ICD-10-CM

## 2012-02-10 DIAGNOSIS — Z3490 Encounter for supervision of normal pregnancy, unspecified, unspecified trimester: Secondary | ICD-10-CM

## 2012-02-10 NOTE — Progress Notes (Signed)
Pt reports unsure if leaking, no gush, just moisture in underwear>exam (no pooling, fern negative), smell of urine during exam.  NST today - reactive. Return for NST Tuesday with plan of induction at 41 wks if undelivered.

## 2012-02-10 NOTE — Progress Notes (Signed)
p-89  Poss leakage

## 2012-02-14 ENCOUNTER — Ambulatory Visit (INDEPENDENT_AMBULATORY_CARE_PROVIDER_SITE_OTHER): Payer: Medicaid Other | Admitting: Obstetrics & Gynecology

## 2012-02-14 VITALS — BP 123/77 | Wt 142.0 lb

## 2012-02-14 DIAGNOSIS — Z3480 Encounter for supervision of other normal pregnancy, unspecified trimester: Secondary | ICD-10-CM

## 2012-02-14 DIAGNOSIS — O480 Post-term pregnancy: Secondary | ICD-10-CM

## 2012-02-14 NOTE — Progress Notes (Signed)
P-83 

## 2012-02-14 NOTE — Progress Notes (Signed)
Pt has no complaints.  Induction set up for Friday morning.  NST reactive and reassuring.

## 2012-02-15 ENCOUNTER — Encounter (HOSPITAL_COMMUNITY): Payer: Self-pay | Admitting: *Deleted

## 2012-02-15 ENCOUNTER — Inpatient Hospital Stay (HOSPITAL_COMMUNITY)
Admission: AD | Admit: 2012-02-15 | Discharge: 2012-02-17 | DRG: 775 | Disposition: A | Discharge status: Home / Self Care | Payer: Medicaid Other | Source: Ambulatory Visit | Attending: Family Medicine | Admitting: Family Medicine

## 2012-02-15 DIAGNOSIS — Z22330 Carrier of Group B streptococcus: Secondary | ICD-10-CM

## 2012-02-15 DIAGNOSIS — O9989 Other specified diseases and conditions complicating pregnancy, childbirth and the puerperium: Secondary | ICD-10-CM

## 2012-02-15 DIAGNOSIS — O99892 Other specified diseases and conditions complicating childbirth: Principal | ICD-10-CM | POA: Diagnosis present

## 2012-02-15 LAB — CBC
HCT: 37.4 % (ref 36.0–46.0)
Hemoglobin: 12.6 g/dL (ref 12.0–15.0)
RDW: 13.5 % (ref 11.5–15.5)
WBC: 18.1 10*3/uL — ABNORMAL HIGH (ref 4.0–10.5)

## 2012-02-15 MED ORDER — ACETAMINOPHEN 325 MG PO TABS: 650.0000 mg | ORAL_TABLET | ORAL | Status: DC | PRN

## 2012-02-15 MED ORDER — OXYTOCIN 40 UNITS IN LACTATED RINGERS INFUSION - SIMPLE MED
62.5000 mL/h | INTRAVENOUS | Status: DC
  Administered 2012-02-15: 62.5 mL/h via INTRAVENOUS
  Filled 2012-02-15: qty 1000

## 2012-02-15 MED ORDER — LANOLIN HYDROUS EX OINT: TOPICAL_OINTMENT | CUTANEOUS | Status: DC | PRN

## 2012-02-15 MED ORDER — ONDANSETRON HCL 4 MG/2ML IJ SOLN: 4.0000 mg | INTRAMUSCULAR | Status: DC | PRN

## 2012-02-15 MED ORDER — CITRIC ACID-SODIUM CITRATE 334-500 MG/5ML PO SOLN: 30.0000 mL | ORAL | Status: DC | PRN

## 2012-02-15 MED ORDER — LIDOCAINE HCL (PF) 1 % IJ SOLN
30.0000 mL | INTRAMUSCULAR | Status: DC | PRN
  Filled 2012-02-15: qty 30

## 2012-02-15 MED ORDER — IBUPROFEN 600 MG PO TABS
600.0000 mg | ORAL_TABLET | Freq: Four times a day (QID) | ORAL | Status: DC | PRN
  Filled 2012-02-15: qty 1

## 2012-02-15 MED ORDER — BUTORPHANOL TARTRATE 1 MG/ML IJ SOLN
1.0000 mg | Freq: Once | INTRAMUSCULAR | Status: AC
  Administered 2012-02-15: 1 mg via INTRAMUSCULAR
  Filled 2012-02-15: qty 1

## 2012-02-15 MED ORDER — OXYTOCIN BOLUS FROM INFUSION: 500.0000 mL | INTRAVENOUS | Status: DC

## 2012-02-15 MED ORDER — SIMETHICONE 80 MG PO CHEW: 80.0000 mg | CHEWABLE_TABLET | ORAL | Status: DC | PRN

## 2012-02-15 MED ORDER — TETANUS-DIPHTH-ACELL PERTUSSIS 5-2.5-18.5 LF-MCG/0.5 IM SUSP
0.5000 mL | Freq: Once | INTRAMUSCULAR | Status: AC
  Administered 2012-02-16: 0.5 mL via INTRAMUSCULAR
  Filled 2012-02-15: qty 0.5

## 2012-02-15 MED ORDER — IBUPROFEN 600 MG PO TABS
600.0000 mg | ORAL_TABLET | Freq: Four times a day (QID) | ORAL | Status: DC
  Administered 2012-02-15 – 2012-02-17 (×10): 600 mg via ORAL
  Filled 2012-02-15 (×10): qty 1

## 2012-02-15 MED ORDER — OXYCODONE-ACETAMINOPHEN 5-325 MG PO TABS
1.0000 | ORAL_TABLET | ORAL | Status: DC | PRN
  Administered 2012-02-15: 2 via ORAL
  Filled 2012-02-15: qty 2

## 2012-02-15 MED ORDER — FENTANYL CITRATE 0.05 MG/ML IJ SOLN
100.0000 ug | INTRAMUSCULAR | Status: DC | PRN
  Administered 2012-02-15: 100 ug via INTRAVENOUS
  Filled 2012-02-15: qty 2

## 2012-02-15 MED ORDER — LACTATED RINGERS IV SOLN: 500.0000 mL | INTRAVENOUS | Status: DC | PRN

## 2012-02-15 MED ORDER — PRENATAL MULTIVITAMIN CH
1.0000 | ORAL_TABLET | Freq: Every day | ORAL | Status: DC
  Administered 2012-02-16 – 2012-02-17 (×2): 1 via ORAL
  Filled 2012-02-15 (×2): qty 1

## 2012-02-15 MED ORDER — BENZOCAINE-MENTHOL 20-0.5 % EX AERO
1.0000 "application " | INHALATION_SPRAY | CUTANEOUS | Status: DC | PRN
  Filled 2012-02-15: qty 56

## 2012-02-15 MED ORDER — VANCOMYCIN HCL IN DEXTROSE 1-5 GM/200ML-% IV SOLN
1000.0000 mg | Freq: Two times a day (BID) | INTRAVENOUS | Status: DC
  Administered 2012-02-15: 1000 mg via INTRAVENOUS
  Filled 2012-02-15 (×2): qty 200

## 2012-02-15 MED ORDER — WITCH HAZEL-GLYCERIN EX PADS: 1.0000 "application " | MEDICATED_PAD | CUTANEOUS | Status: DC | PRN

## 2012-02-15 MED ORDER — OXYCODONE-ACETAMINOPHEN 5-325 MG PO TABS
1.0000 | ORAL_TABLET | ORAL | Status: DC | PRN
  Administered 2012-02-15 – 2012-02-17 (×5): 1 via ORAL
  Filled 2012-02-15 (×5): qty 1

## 2012-02-15 MED ORDER — DIBUCAINE 1 % RE OINT: 1.0000 "application " | TOPICAL_OINTMENT | RECTAL | Status: DC | PRN

## 2012-02-15 MED ORDER — SENNOSIDES-DOCUSATE SODIUM 8.6-50 MG PO TABS
2.0000 | ORAL_TABLET | Freq: Every day | ORAL | Status: DC
  Administered 2012-02-15 – 2012-02-16 (×2): 2 via ORAL

## 2012-02-15 MED ORDER — FLEET ENEMA 7-19 GM/118ML RE ENEM: 1.0000 | ENEMA | RECTAL | Status: DC | PRN

## 2012-02-15 MED ORDER — ONDANSETRON HCL 4 MG PO TABS: 4.0000 mg | ORAL_TABLET | ORAL | Status: DC | PRN

## 2012-02-15 MED ORDER — ZOLPIDEM TARTRATE 5 MG PO TABS: 5.0000 mg | ORAL_TABLET | Freq: Every evening | ORAL | Status: DC | PRN

## 2012-02-15 MED ORDER — ONDANSETRON HCL 4 MG/2ML IJ SOLN: 4.0000 mg | Freq: Four times a day (QID) | INTRAMUSCULAR | Status: DC | PRN

## 2012-02-15 MED ORDER — DIPHENHYDRAMINE HCL 25 MG PO CAPS: 25.0000 mg | ORAL_CAPSULE | Freq: Four times a day (QID) | ORAL | Status: DC | PRN

## 2012-02-15 MED ORDER — LACTATED RINGERS IV SOLN
INTRAVENOUS | Status: DC
Start: 2012-02-15 — End: 2012-02-15

## 2012-02-15 MED ORDER — CLINDAMYCIN PHOSPHATE 900 MG/50ML IV SOLN
900.0000 mg | Freq: Three times a day (TID) | INTRAVENOUS | Status: DC
  Administered 2012-02-15: 900 mg via INTRAVENOUS
  Filled 2012-02-15 (×2): qty 50

## 2012-02-15 NOTE — MAU Note (Signed)
Pt states she is in labor

## 2012-02-15 NOTE — H&P (Signed)
HPI: Jennifer Ryan is a 27 y.o. year old G1P0 female at [redacted]w[redacted]d weeks gestation by LMP who presents for  Spontaneous rupture of membranes Labor.  +/- for contractions, leakage of fluid, vaginal bleeding, fetal movement  Address pre-eclampsia Sx and RUQ pain   Maternal Medical History:  Reason for admission: Reason for admission: rupture of membranes and contractions.  Contractions: Onset was 3-5 hours ago.   Frequency: regular.    Prenatal complications: No pre-eclampsia.   Prenatal Complications - Diabetes: none.    OB History    Grav Para Term Preterm Abortions TAB SAB Ect Mult Living   1         0     Past Medical History  Diagnosis Date  . No pertinent past medical history    Past Surgical History  Procedure Date  . Wisdom tooth extraction 2007   Family History: family history includes Brain cancer in an unspecified family member; Depression in her maternal aunt; Diabetes in an unspecified family member; Heart attack in her paternal uncle; Hyperlipidemia in an unspecified family member; Hypertension in her paternal grandfather and paternal grandmother; and Prostate cancer in her paternal grandfather. Social History:  reports that she has never smoked. She has never used smokeless tobacco. She reports that she does not drink alcohol or use illicit drugs.   Prenatal Transfer Tool  Maternal Diabetes: No Genetic Screening: Normal Maternal Ultrasounds/Referrals: Normal Fetal Ultrasounds or other Referrals:  None Maternal Substance Abuse:  No Significant Maternal Medications:  None Significant Maternal Lab Results:  Lab values include: Group B Strep positive Other Comments:  GHTN   ROS  Dilation: 10 Station: +2 Exam by:: M.Topp,RN Blood pressure 141/95, pulse 68, resp. rate 22, last menstrual period 05/05/2011. Maternal Exam:  Uterine Assessment: Contraction strength is firm.  Contraction frequency is regular.   Abdomen: Fetal presentation: vertex  Cervix: Cervix  evaluated by digital exam.     Physical Exam  Constitutional: She is oriented to person, place, and time. She appears well-developed and well-nourished.  Eyes: EOM are normal.  Cardiovascular: Normal rate.   Neurological: She is alert and oriented to person, place, and time.  Skin: Skin is warm.    Prenatal labs: ABO, Rh: O/POS/-- (06/04 1129) Antibody: NEG (06/04 1129) Rubella: 99.5 (06/04 1129) RPR: NON REAC (10/18 1019)  HBsAg: NEGATIVE (06/04 1129)  HIV: NON REACTIVE (10/18 1019)  GBS:     Assessment/Plan: 1)Active Labor - Admit pt, Vanc for GBS + PCN allergic (Clinda sensitive, can consider switching).   - Fentanyl for pain, dilated to 10 cm, too advanced for epidural   Gildardo Cranker 02/15/2012, 5:14 AM  I have seen and examined this patient and I agree with the above. Cam Hai 9:31 AM 02/15/2012

## 2012-02-16 NOTE — Progress Notes (Signed)
Ur chart review completed.  

## 2012-02-16 NOTE — Progress Notes (Signed)
Post Partum Day 1 Subjective: no complaints, up ad lib, voiding, tolerating PO and + flatus Breastfeeding going well. Still some discomfort, but controlled.   Objective: Blood pressure 110/71, pulse 71, temperature 98.5 F (36.9 C), temperature source Oral, resp. rate 18, height 5\' 1"  (1.549 m), weight 64.411 kg (142 lb), last menstrual period 05/05/2011, SpO2 99.00%, unknown if currently breastfeeding.  Physical Exam:  General: alert, cooperative and no distress Lochia: appropriate Uterine Fundus: firm Incision: na DVT Evaluation: No evidence of DVT seen on physical exam. No cords or calf tenderness. No significant calf/ankle edema.   Basename 02/15/12 0505  HGB 12.6  HCT 37.4    Assessment/Plan: Plan for discharge tomorrow and Breastfeeding Unknown birthcontrol at this point, seems to be leaning toward depo.   LOS: 1 day   CONROY, LOUISA 02/16/2012, 7:42 AM    I have seen and examined this patient and agree the above assessment. CRESENZO-DISHMAN,Chimene Salo 02/16/2012 7:52 AM

## 2012-02-16 NOTE — H&P (Signed)
Chart reviewed and agree with management and plan.  

## 2012-02-17 ENCOUNTER — Inpatient Hospital Stay (HOSPITAL_COMMUNITY): Admission: RE | Admit: 2012-02-17 | Payer: Medicaid Other | Source: Ambulatory Visit

## 2012-02-17 LAB — HEMOGLOBIN AND HEMATOCRIT, BLOOD
HCT: 34.3 % — ABNORMAL LOW (ref 36.0–46.0)
Hemoglobin: 11.1 g/dL — ABNORMAL LOW (ref 12.0–15.0)

## 2012-02-17 MED ORDER — SENNOSIDES-DOCUSATE SODIUM 8.6-50 MG PO TABS: 1.0000 | ORAL_TABLET | Freq: Every day | ORAL | Status: DC

## 2012-02-17 MED ORDER — IBUPROFEN 600 MG PO TABS: 600.0000 mg | ORAL_TABLET | Freq: Four times a day (QID) | ORAL | Status: DC

## 2012-02-17 NOTE — Discharge Summary (Signed)
Obstetric Discharge Summary Reason for Admission: onset of labor Prenatal Procedures: ultrasound Intrapartum Procedures: spontaneous vaginal delivery Postpartum Procedures: none Complications-Operative and Postpartum: none Hemoglobin  Date Value Range Status  02/15/2012 12.6  12.0 - 15.0 g/dL Final     HCT  Date Value Range Status  02/15/2012 37.4  36.0 - 46.0 % Final    Physical Exam:  General: alert, cooperative and no distress Lochia: appropriate Uterine Fundus: firm Incision: N/A DVT Evaluation: No evidence of DVT seen on physical exam. Negative Homan's sign. No significant calf/ankle edema.  Still undecided on birth control, breast feeding  Discharge Diagnoses: Term Pregnancy-delivered  Discharge Information: Date: 02/17/2012 Activity: pelvic rest Diet: routine Medications: PNV and Ibuprofen, Senkot-S Condition: stable Instructions: refer to practice specific booklet Discharge to: home Follow-up Information    Follow up with [facility] [place]. Schedule an appointment as soon as possible for a visit in 6 weeks.   Contact information:   [address] [place] [address] [phone]         Newborn Data: Live born female  Birth Weight: 5 lb 9 oz (2523 g) APGAR: 5, 9  In NICU due to tachypnea/GBS status.  Jennifer Ryan 02/17/2012, 7:59 AM

## 2012-02-23 ENCOUNTER — Ambulatory Visit (INDEPENDENT_AMBULATORY_CARE_PROVIDER_SITE_OTHER): Payer: Medicaid Other | Admitting: Obstetrics & Gynecology

## 2012-02-23 ENCOUNTER — Encounter: Payer: Self-pay | Admitting: Obstetrics & Gynecology

## 2012-02-23 VITALS — BP 122/77 | HR 102 | Temp 97.1°F

## 2012-02-23 DIAGNOSIS — R82998 Other abnormal findings in urine: Secondary | ICD-10-CM

## 2012-02-23 DIAGNOSIS — N390 Urinary tract infection, site not specified: Secondary | ICD-10-CM

## 2012-02-23 DIAGNOSIS — R8290 Unspecified abnormal findings in urine: Secondary | ICD-10-CM

## 2012-02-23 LAB — POCT URINALYSIS DIPSTICK
Nitrite, UA: NEGATIVE
Protein, UA: 1
Spec Grav, UA: >=1.03
Urobilinogen, UA: NEGATIVE
pH, UA: 5

## 2012-02-23 MED ORDER — SULFAMETHOXAZOLE-TRIMETHOPRIM 800-160 MG PO TABS: 1.0000 | ORAL_TABLET | Freq: Two times a day (BID) | ORAL | Status: DC

## 2012-02-23 NOTE — Progress Notes (Signed)
  Subjective:    Patient ID: Jennifer Ryan, female    DOB: 10/06/1985, 27 y.o.   MRN: [id number]  HPI  Summer is here because of UTI symptoms, dysuria, pelvic discomfort. She is having no other pp problems. She denies PP blues.  Review of Systems     Objective:   Physical Exam  No CVAT 2+ leuks on ua      Assessment & Plan:   UTI- Rec increase po H20 Bactrim DS for a week Check urine culture Schedule pp visit in 5 weeks

## 2012-03-29 ENCOUNTER — Ambulatory Visit (INDEPENDENT_AMBULATORY_CARE_PROVIDER_SITE_OTHER): Payer: Medicaid Other | Admitting: Obstetrics & Gynecology

## 2012-03-29 ENCOUNTER — Encounter: Payer: Self-pay | Admitting: Obstetrics & Gynecology

## 2012-03-29 MED ORDER — LEVONORGESTREL-ETHINYL ESTRAD 0.1-20 MG-MCG PO TABS: 1.0000 | ORAL_TABLET | Freq: Every day | ORAL | Status: DC

## 2012-03-29 NOTE — Progress Notes (Signed)
  Subjective:    Patient ID: Jennifer Ryan, female    DOB: May 15, 1985, 27 y.o.   MRN: [id number]  HPI Jennifer Ryan is here for her 6 week pp visit. She had a NSVD with no tears and her son is growing well. She is breastfeeding with a large milk supply. She denies pp depression and reports normal bowel and bladder function. She has not had sex yet and thinks that she wants another pregnancy in about 2 years. We discussed her birth control options. After hearing about the POP and its 80% effectiveness rate, she opts to try regular low dose OCPs. She understands that her milk supply may decrease and she will call to change to POP if this happens.   Review of Systems     Objective:   Physical Exam  Normal vulva and vagina and cervix NSSR, NT, mobile, normal adnexal exam      Assessment & Plan:  Pp- doing well RTC about 2-3 months for annual/prn sooner.

## 2013-04-13 ENCOUNTER — Other Ambulatory Visit: Payer: Self-pay | Admitting: Obstetrics & Gynecology

## 2013-07-26 IMAGING — US US OB FOLLOW-UP
1 series · 12 of 28 positions shown · non-contrast
Comparison: none

[Series 1: us ob follow-up · 12 of 38 slices shown]
[im 2/38]
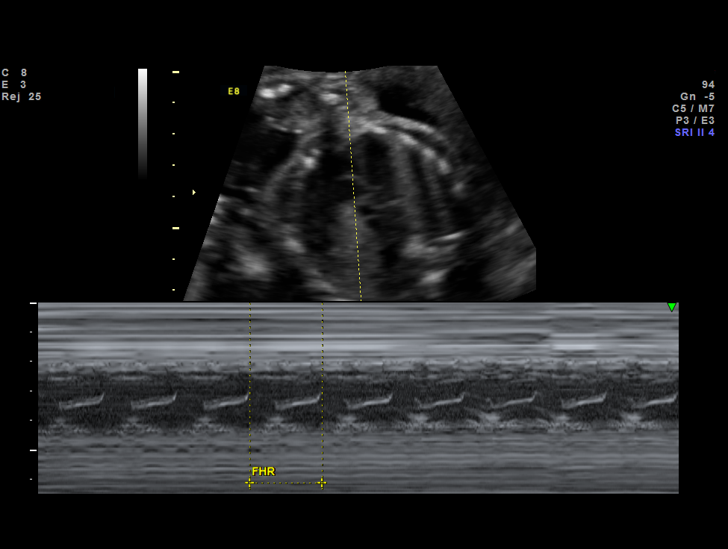
[im 5/38]
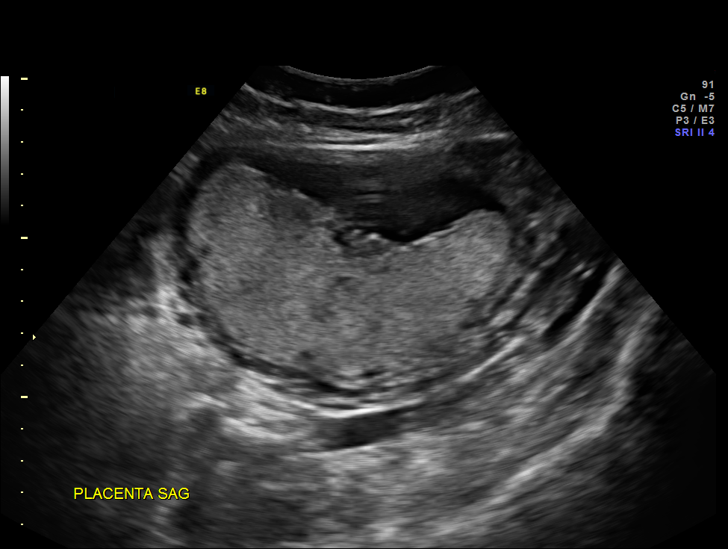
[im 7/38]
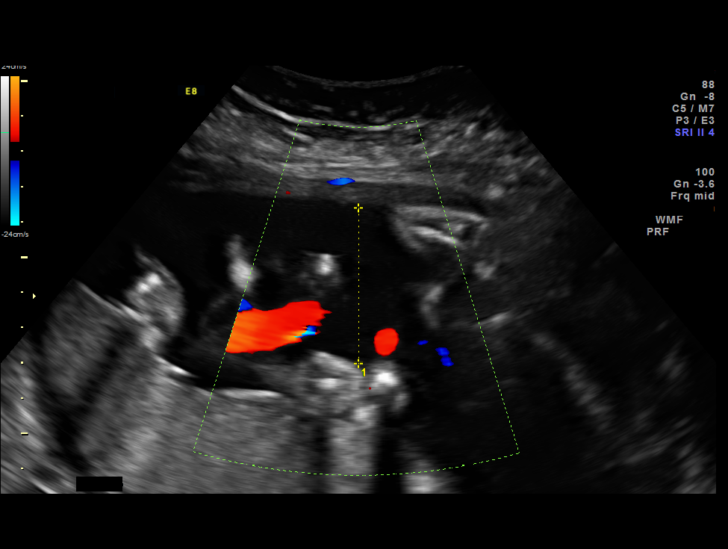
[im 11/38]
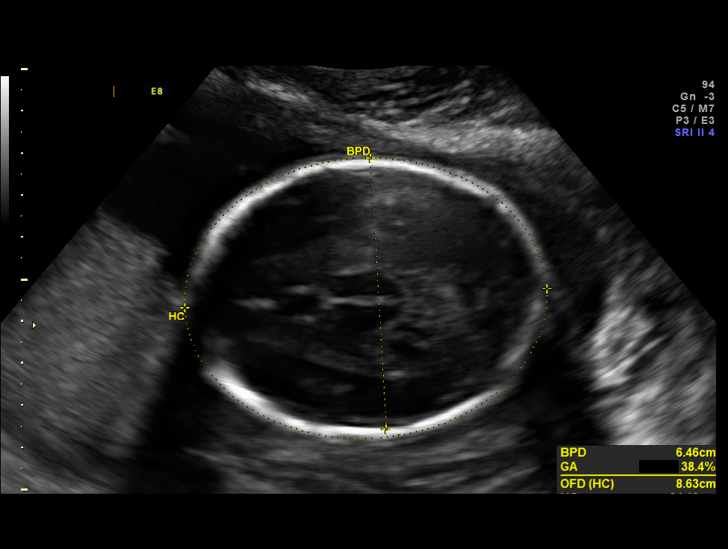
[im 14/38]
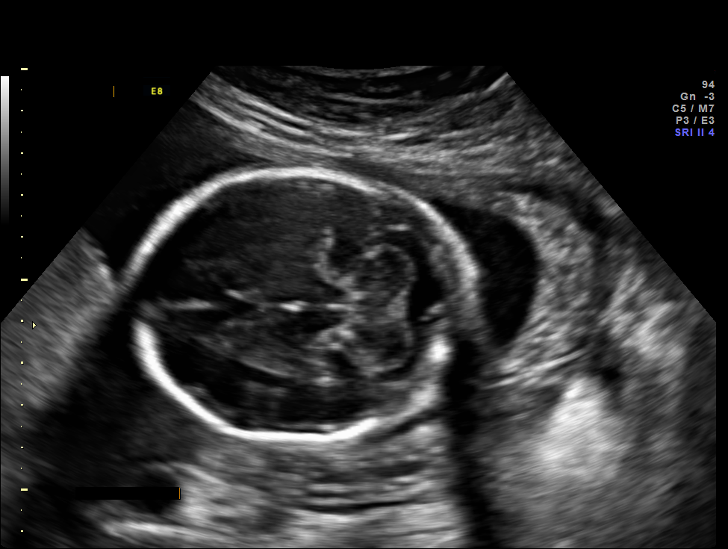
[im 17/38]
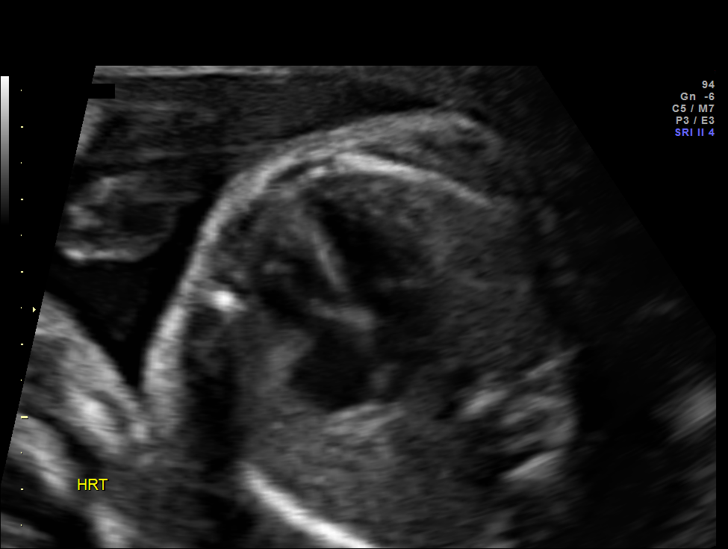
[im 21/38]
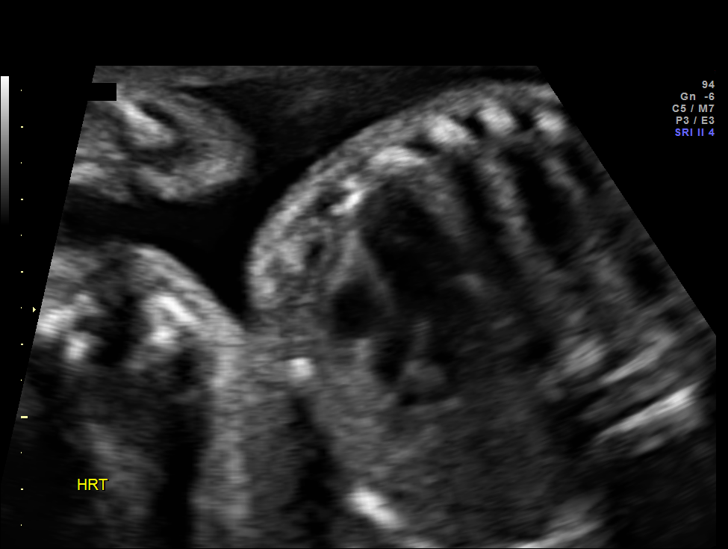
[im 24/38]
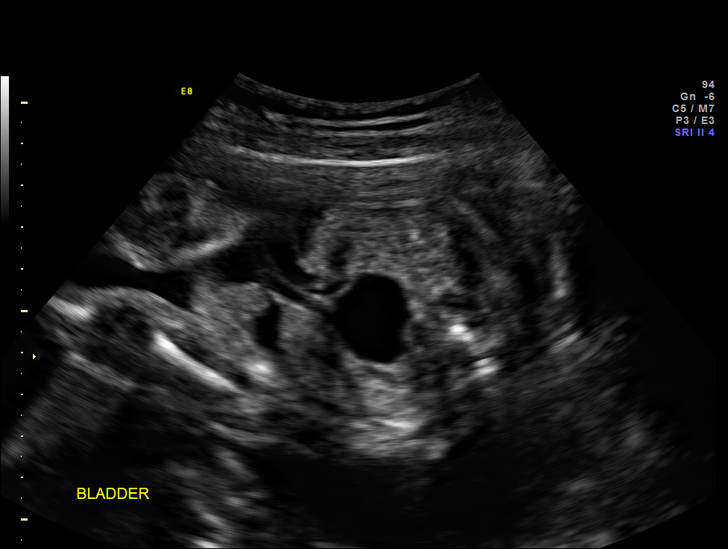
[im 27/38]
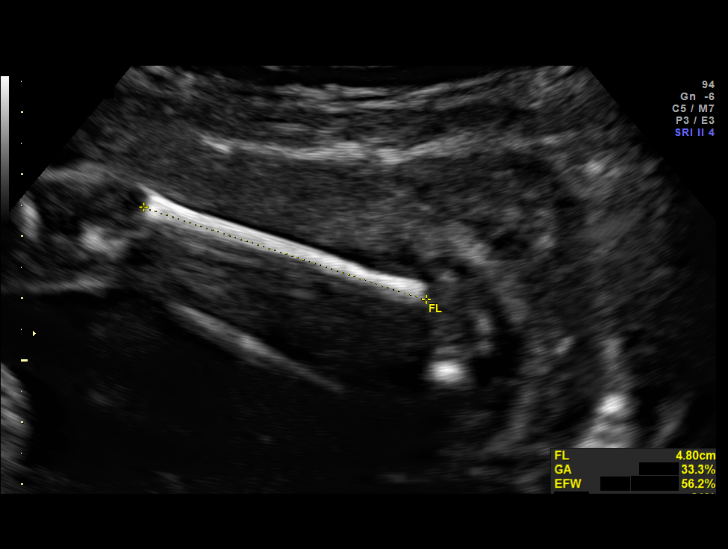
[im 31/38]
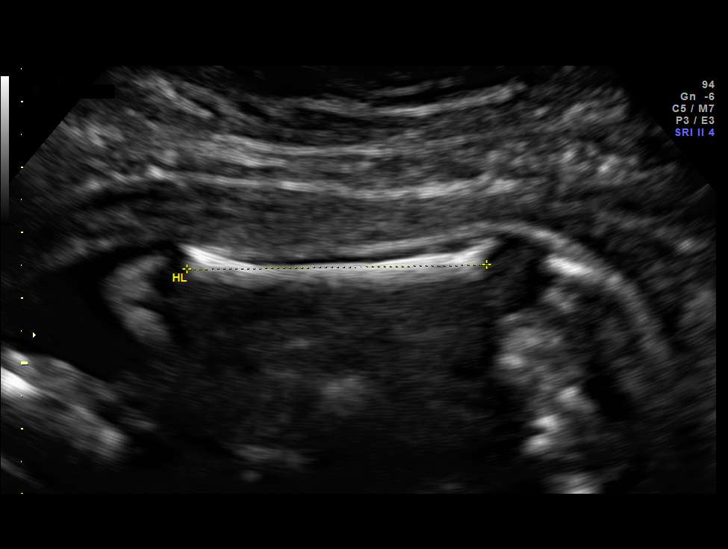
[im 33/38]
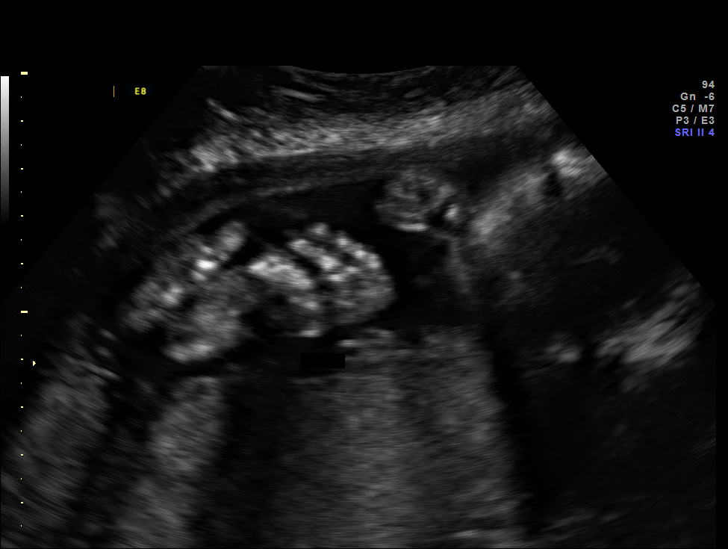
[im 36/38]
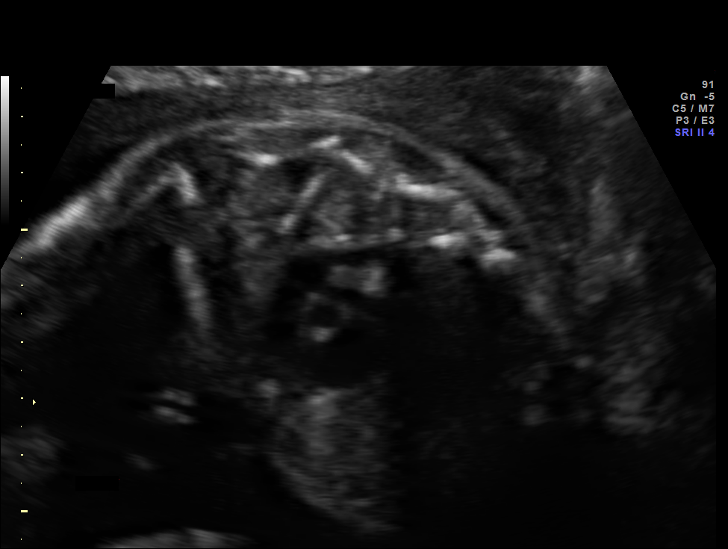

[12 of 28 positions shown; findings below may reference images not displayed]

OBSTETRICS REPORT
                      (Signed Final 11/04/2011 [DATE])

Service(s) Provided

 US OB FOLLOW UP                                       76816.1
Indications

 History of genetic abnormality - Cystic Fibrosis
 Gene Carrier
 Assess Fetal Growth / Estimated Fetal Weight
Fetal Evaluation

 Num Of Fetuses:    1
 Fetal Heart Rate:  139                          bpm
 Cardiac Activity:  Observed
 Presentation:      Frank breech
 Placenta:          Posterior, above cervical
                    os
 P. Cord            Previously Visualized
 Insertion:

 Amniotic Fluid
 AFI FV:      Subjectively within normal limits
                                             Larg Pckt:     4.4  cm
Biometry

 BPD:     64.3  mm     G. Age:  26w 0d                CI:         75.2   70 - 86
 OFD:     85.5  mm                                    FL/HC:      19.9   18.6 -

 HC:     239.6  mm     G. Age:  26w 0d       22  %    HC/AC:      1.07   1.04 -

 AC:     224.1  mm     G. Age:  26w 6d       62  %    FL/BPD:     74.2   71 - 87
 FL:      47.7  mm     G. Age:  26w 0d       31  %    FL/AC:      21.3   20 - 24
 HUM:     45.6  mm     G. Age:  27w 0d       65  %
 CER:     30.5  mm     G. Age:  26w 5d       64  %

 Est. FW:     930  gm      2 lb 1 oz     59  %
Gestational Age

 LMP:           26w 1d        Date:  05/05/11                 EDD:   02/09/12
 U/S Today:     26w 2d                                        EDD:   02/08/12
 Best:          26w 1d     Det. By:  LMP  (05/05/11)          EDD:   02/09/12
Anatomy

 Cranium:          Appears normal         Ductal Arch:      Previously seen
 Fetal Cavum:      Appears normal         Diaphragm:        Previously seen
 Ventricles:       Appears normal         Stomach:          Appears normal, left
                                                            sided
 Choroid Plexus:   Previously seen        Abdomen:          Appears normal
 Cerebellum:       Appears normal         Abdominal Wall:   Previously seen
 Posterior Fossa:  Appears normal         Cord Vessels:     Previously seen
 Nuchal Fold:      Previously seen        Kidneys:          Appear normal
 Face:             Appears normal         Bladder:          Appears normal
                   (orbits and profile)
 Lips:             Appears normal         Spine:            Previously seen
 Heart:            Appears normal (4      Lower             Previously seen
                   chamber & axis)        Extremities:
 RVOT:             Previously seen        Upper             Previously seen
                                          Extremities:
 LVOT:             Previously seen        Limbs:            Previously seen
 Aortic Arch:      Previously seen

 Other:  Male gender. Heels and 5th digit previously seen. Nasal bone
         visualized.
Targeted Anatomy

 Fetal Central Nervous System
 Cisterna Magna:
Cervix Uterus Adnexa

 Cervical Length:    3        cm

 Cervix:       Normal appearance by transabdominal scan.
Impression

 Single IUP at 26 [DATE] weeks
 Normal interval anatomy - normal fetal profile noted on
 today's study; anatomic survey now complete
 Growth is appropriate (59th %tile)
 Normal amniotic fluid volume
Recommendations

 Recommend follow up as clinically indicated

## 2013-09-06 ENCOUNTER — Ambulatory Visit (INDEPENDENT_AMBULATORY_CARE_PROVIDER_SITE_OTHER): Payer: Medicaid Other | Admitting: Family

## 2013-09-06 ENCOUNTER — Encounter: Payer: Self-pay | Admitting: Family

## 2013-09-06 ENCOUNTER — Other Ambulatory Visit (HOSPITAL_COMMUNITY)
Admission: RE | Admit: 2013-09-06 | Discharge: 2013-09-06 | Disposition: A | Discharge status: Home / Self Care | Payer: Medicaid Other | Source: Ambulatory Visit | Attending: Family | Admitting: Family

## 2013-09-06 ENCOUNTER — Ambulatory Visit: Payer: Medicaid Other

## 2013-09-06 VITALS — BP 123/73 | HR 94 | Resp 16 | Ht 60.0 in | Wt 108.0 lb

## 2013-09-06 DIAGNOSIS — Z113 Encounter for screening for infections with a predominantly sexual mode of transmission: Secondary | ICD-10-CM | POA: Diagnosis present

## 2013-09-06 DIAGNOSIS — Z3041 Encounter for surveillance of contraceptive pills: Secondary | ICD-10-CM | POA: Diagnosis not present

## 2013-09-06 DIAGNOSIS — Z01419 Encounter for gynecological examination (general) (routine) without abnormal findings: Secondary | ICD-10-CM | POA: Diagnosis not present

## 2013-09-06 MED ORDER — LEVONORGESTREL-ETHINYL ESTRAD 0.1-20 MG-MCG PO TABS: 1.0000 | ORAL_TABLET | Freq: Every day | ORAL | Status: DC

## 2013-09-06 NOTE — Addendum Note (Signed)
Addended by: Granville LewisLARK, Mars Scheaffer L on: 09/06/2013 10:55 AM   Modules accepted: Orders

## 2013-09-06 NOTE — Progress Notes (Signed)
  Subjective:     Jennifer Ryan is a 28 y.o. female here for a routine exam.  Current complaints: none.  Personal health questionnaire reviewed: yes.  Desires refill of OCPs.  Currently breastfeeding 18 month son.     Gynecologic History Patient's last menstrual period was 08/23/2013. Contraception: OCP (estrogen/progesterone) Last Pap: 2012. Results were: normal Last mammogram: never.   Obstetric History OB History  Gravida Para Term Preterm AB SAB TAB Ectopic Multiple Living  1 1 1       1     # Outcome Date GA Lbr Len/2nd Weight Sex Delivery Anes PTL Lv  1 TRM 02/15/12 6011w6d 05:09 / 00:52 5 lb 9 oz (2.523 kg) M SVD None  Y       The following portions of the patient's history were reviewed and updated as appropriate: allergies, current medications, past family history, past medical history, past social history, past surgical history and problem list.  Review of Systems Pertinent items are noted in HPI.    Objective:   BP 123/73  Pulse 94  Resp 16  Ht 5' (1.524 m)  Wt 108 lb (48.988 kg)  BMI 21.09 kg/m2  LMP 08/23/2013  Breastfeeding? Yes General appearance: alert, cooperative and appears stated age Head: Normocephalic, without obvious abnormality, atraumatic Neck: no adenopathy, no carotid bruit, no JVD, supple, symmetrical, trachea midline and thyroid not enlarged, symmetric, no tenderness/mass/nodules Lungs: clear to auscultation bilaterally Breasts: normal appearance, no masses or tenderness, No nipple retraction or dimpling, No nipple discharge or bleeding, No axillary or supraclavicular adenopathy, Normal to palpation without dominant masses, Taught monthly breast self examination Heart: regular rate and rhythm, S1, S2 normal, no murmur, click, rub or gallop Abdomen: soft, non-tender; bowel sounds normal; no masses,  no organomegaly Pelvic: cervix normal in appearance, external genitalia normal, no adnexal masses or tenderness, no cervical motion tenderness,  rectovaginal septum normal, uterus normal size, shape, and consistency and vagina normal without discharge Skin: Skin color, texture, turgor normal. No rashes or lesions     Assessment:    Healthy female exam.    Plan:    Contraception: OCP (estrogen/progesterone).   Pap smear sent lab.  Eino FarberWalidah Kennith GainN Karim, CNM

## 2013-09-10 LAB — CYTOLOGY - PAP

## 2013-12-02 ENCOUNTER — Encounter: Payer: Self-pay | Admitting: Family

## 2015-01-19 ENCOUNTER — Ambulatory Visit (INDEPENDENT_AMBULATORY_CARE_PROVIDER_SITE_OTHER): Payer: Medicaid Other | Admitting: Advanced Practice Midwife

## 2015-01-19 ENCOUNTER — Encounter: Payer: Self-pay | Admitting: Advanced Practice Midwife

## 2015-01-19 VITALS — BP 128/67 | HR 98 | Wt 122.0 lb

## 2015-01-19 DIAGNOSIS — Z3481 Encounter for supervision of other normal pregnancy, first trimester: Secondary | ICD-10-CM

## 2015-01-19 DIAGNOSIS — Z36 Encounter for antenatal screening of mother: Secondary | ICD-10-CM | POA: Diagnosis not present

## 2015-01-19 DIAGNOSIS — Z3480 Encounter for supervision of other normal pregnancy, unspecified trimester: Secondary | ICD-10-CM | POA: Insufficient documentation

## 2015-01-19 DIAGNOSIS — O09291 Supervision of pregnancy with other poor reproductive or obstetric history, first trimester: Secondary | ICD-10-CM

## 2015-01-19 DIAGNOSIS — Z8759 Personal history of other complications of pregnancy, childbirth and the puerperium: Secondary | ICD-10-CM | POA: Insufficient documentation

## 2015-01-19 DIAGNOSIS — Z3491 Encounter for supervision of normal pregnancy, unspecified, first trimester: Secondary | ICD-10-CM

## 2015-01-19 DIAGNOSIS — N96 Recurrent pregnancy loss: Secondary | ICD-10-CM | POA: Insufficient documentation

## 2015-01-19 DIAGNOSIS — Z141 Cystic fibrosis carrier: Secondary | ICD-10-CM

## 2015-01-19 NOTE — Progress Notes (Signed)
Bedside US shows single IUP with CRL measuring 741w1d and FHR 166

## 2015-01-19 NOTE — Progress Notes (Signed)
Subjective:  Jennifer Ryan is a 29 y.o. 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  Patient reports no complaints.  Contractions: Not present. Vag. Bleeding: None.  Movement: Absent. Denies leaking of fluid.   The following portions of the patient's history were reviewed and updated as appropriate: allergies, current medications, past family history, past medical history, past social history, past surgical history and problem list. Problem list updated.  Objective:   Filed Vitals:   01/19/15 1052  BP: 128/67  Pulse: 98  Weight: 122 lb (55.339 kg)    Fetal Status: Fetal Heart Rate (bpm): 166   Movement: Absent     General:  Alert, oriented and cooperative. Patient is in no acute distress.  Skin: Skin is warm and dry. No rash noted.   Cardiovascular: Normal heart rate noted  Respiratory: Normal respiratory effort, no problems with respiration noted  Abdomen: Soft, gravid, appropriate for gestational age. Pain/Pressure: Absent     Pelvic: Vag. Bleeding: None Vag D/C Character: Thin   Cervical exam deferred        Extremities: Normal range of motion.  Edema: None  Mental Status: Normal mood and affect. Normal behavior. Normal judgment and thought content.   Urinalysis:      Assessment and Plan:  Pregnancy: G2P1001 at 5573w1d  1. Normal pregnancy, first trimester - NEW OB WORK-UP - Prenatal Profile - GC/Chlamydia Probe Amp --Enrolled in Baby Scripts - Culture, OB Urine --Reviewed genetic screening with pt and husband.  Pt declines genetic screening at this time.  2. Cystic fibrosis gene carrier --Husband negative  3. Encounter for supervision of other normal pregnancy in first trimester   General obstetric precautions including but not limited to vaginal bleeding, contractions,  leaking of fluid and fetal movement were reviewed in detail with the patient. Please refer to After Visit Summary for other counseling recommendations.  Return in about 4 weeks (around 02/16/2015).   Hurshel PartyLisa A Leftwich-Kirby, CNM

## 2015-01-20 LAB — PRENATAL PROFILE (SOLSTAS)
Antibody Screen: NEGATIVE
BASOS PCT: 0 % (ref 0–1)
Basophils Absolute: 0 10*3/uL (ref 0.0–0.1)
EOS PCT: 2 % (ref 0–5)
Eosinophils Absolute: 0.1 10*3/uL (ref 0.0–0.7)
HEMATOCRIT: 39.7 % (ref 36.0–46.0)
HEMOGLOBIN: 13.6 g/dL (ref 12.0–15.0)
HIV 1&2 Ab, 4th Generation: NONREACTIVE
Hepatitis B Surface Ag: NEGATIVE
LYMPHS ABS: 1.3 10*3/uL (ref 0.7–4.0)
Lymphocytes Relative: 17 % (ref 12–46)
MCH: 29.6 pg (ref 26.0–34.0)
MCHC: 34.3 g/dL (ref 30.0–36.0)
MCV: 86.5 fL (ref 78.0–100.0)
MONO ABS: 0.4 10*3/uL (ref 0.1–1.0)
MONOS PCT: 6 % (ref 3–12)
MPV: 9.4 fL (ref 8.6–12.4)
NEUTROS ABS: 5.6 10*3/uL (ref 1.7–7.7)
NEUTROS PCT: 75 % (ref 43–77)
Platelets: 317 10*3/uL (ref 150–400)
RBC: 4.59 MIL/uL (ref 3.87–5.11)
RDW: 12.9 % (ref 11.5–15.5)
RH TYPE: POSITIVE
RUBELLA: 3.58 {index} — AB (ref <0.90)
WBC: 7.4 10*3/uL (ref 4.0–10.5)

## 2015-01-21 ENCOUNTER — Telehealth: Payer: Self-pay | Admitting: *Deleted

## 2015-01-21 DIAGNOSIS — Z3481 Encounter for supervision of other normal pregnancy, first trimester: Secondary | ICD-10-CM

## 2015-01-21 LAB — CULTURE, OB URINE

## 2015-01-21 LAB — GC/CHLAMYDIA PROBE AMP
CT PROBE, AMP APTIMA: NOT DETECTED
GC PROBE AMP APTIMA: NOT DETECTED

## 2015-01-21 NOTE — Telephone Encounter (Signed)
Updated Problem List 

## 2015-01-21 NOTE — Assessment & Plan Note (Signed)
BABYSCRIPTS PATIENT: [X] initial, [ ] 12, [ ] 20, [ ] 28, [ ] 32, [ ] 36, [ ] 38, [ ] 39, [ ] 40 

## 2015-01-24 ENCOUNTER — Other Ambulatory Visit: Payer: Self-pay | Admitting: Advanced Practice Midwife

## 2015-01-24 DIAGNOSIS — O09299 Supervision of pregnancy with other poor reproductive or obstetric history, unspecified trimester: Secondary | ICD-10-CM

## 2015-01-24 HISTORY — DX: Supervision of pregnancy with other poor reproductive or obstetric history, unspecified trimester: O09.299

## 2015-02-01 NOTE — L&D Delivery Note (Signed)
Operative Delivery Note At  1508 on 09/02/15 a viable female was delivered via Vacuum Assisted Vaginal Delivery.  Presentation: compound- right hand; Position: Left,, Occiput,, Anterior; Station: +2, performed by Dr Penne Lash.  Verbal consent: obtained from patient.  Risks and benefits discussed in detail.  Risks include, but are not limited to the risks of anesthesia, bleeding, infection, damage to maternal tissues, fetal cephalhematoma.  There is also the risk of inability to effect vaginal delivery of the head, or shoulder dystocia that cannot be resolved by established maneuvers, leading to the need for emergency cesarean section.  APGAR: 8,9 ; weight: pending  .   Placenta status: spont, intact .   Cord: loose double nuchal, reduced as vtx was delivering  .  Cord pH: 7.215  Anesthesia:  epidural Instruments: Kiwi vac Episiotomy:  none Lacerations:  1st degree perineal Suture Repair: 3.0 monocryl; 3 interrupteds (repair by Dr Chanetta Marshall w/ CNM guidance) Est. Blood Loss (mL):  75cc  Mom to postpartum.  Baby to Couplet care / Skin to Skin.  Cam Hai CNM 09/02/2015, 3:31 PM

## 2015-02-02 ENCOUNTER — Telehealth: Payer: Self-pay | Admitting: *Deleted

## 2015-02-02 DIAGNOSIS — B951 Streptococcus, group B, as the cause of diseases classified elsewhere: Secondary | ICD-10-CM

## 2015-02-02 DIAGNOSIS — O2341 Unspecified infection of urinary tract in pregnancy, first trimester: Principal | ICD-10-CM

## 2015-02-02 MED ORDER — CEPHALEXIN 500 MG PO CAPS: 500.0000 mg | ORAL_CAPSULE | Freq: Four times a day (QID) | ORAL | Status: DC

## 2015-02-02 NOTE — Telephone Encounter (Signed)
Called pt to adv meds will be called into pharmacy per Sharen CounterLisa Leftwich-Kirby, CNM - Advised pt to report to [facility] if she notices any adverse reactions to the medications.  Keflex 500 mg QID x 7 days

## 2015-02-10 ENCOUNTER — Encounter: Payer: Self-pay | Admitting: Certified Nurse Midwife

## 2015-02-11 ENCOUNTER — Ambulatory Visit (INDEPENDENT_AMBULATORY_CARE_PROVIDER_SITE_OTHER): Payer: Self-pay | Admitting: Obstetrics & Gynecology

## 2015-02-11 ENCOUNTER — Encounter: Payer: Self-pay | Admitting: Obstetrics & Gynecology

## 2015-02-11 ENCOUNTER — Telehealth: Payer: Self-pay | Admitting: *Deleted

## 2015-02-11 VITALS — BP 128/76 | HR 110 | Temp 97.4°F | Wt 119.0 lb

## 2015-02-11 DIAGNOSIS — L0390 Cellulitis, unspecified: Secondary | ICD-10-CM

## 2015-02-11 DIAGNOSIS — Z3481 Encounter for supervision of other normal pregnancy, first trimester: Secondary | ICD-10-CM

## 2015-02-11 DIAGNOSIS — Z3491 Encounter for supervision of normal pregnancy, unspecified, first trimester: Secondary | ICD-10-CM

## 2015-02-11 MED ORDER — LINEZOLID 600 MG PO TABS: ORAL_TABLET | ORAL | Status: DC

## 2015-02-11 NOTE — Telephone Encounter (Signed)
Pt called with c/o's of vaginal itching and bumps.  Recommend pt be seen to evaluate.

## 2015-02-11 NOTE — Progress Notes (Signed)
Babyscripts compliant.  Pt has noticed some vaginal bumps that are itchy and swollen

## 2015-02-11 NOTE — Progress Notes (Signed)
Subjective:  Jennifer Ryan is a 30 y.o. G2P1001 at 484w3d being seen today for ongoing prenatal care.  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  Patient reports two day history of worsening vulvar pain, swelling, and redness.  Pt also had a fever yesterday..  Contractions: Not present. Vag. Bleeding: None.  Movement: Increased. Denies leaking of fluid.   The following portions of the patient's history were reviewed and updated as appropriate: allergies, current medications, past family history, past medical history, past social history, past surgical history and problem list. Problem list updated.  Objective:   Filed Vitals:   02/11/15 1007  BP: 128/76  Pulse: 110  Temp: 97.4 F (36.3 C)  Weight: 119 lb (53.978 kg)    Fetal Status: Fetal Heart Rate (bpm): 166   Movement: Increased     General:  Alert, oriented and cooperative. Patient is in no acute distress.  Skin: Skin is warm and dry. No rash noted.   Cardiovascular: Normal heart rate noted  Respiratory: Normal respiratory effort, no problems with respiration noted  Abdomen: Soft, gravid, appropriate for gestational age. Pain/Pressure: Absent     Pelvic: Vag. Bleeding: None Vag D/C Character: Thin   Cervical exam deferred        Labia majora are red and indurated in 5 areas.  There is one pocket that is organzing near the fourchette.  No fluctuant areas for I & D.  Exam c/w cellulitis.  No evidence of ulcers (HSV) or warts.  Pt on Kelfex for UTI when these developed  Extremities: Normal range of motion.  Edema: None  Mental Status: Normal mood and affect. Normal behavior. Normal judgment and thought content.   Urinalysis: Urine Protein: Negative Urine Glucose: Negative  Assessment and Plan:  Pregnancy: G2P1001 at  244w3d  1. Supervision of normal pregnancy, first trimester - US MFM OB COMP + 14 WK; Future  2. Cellulitis, unspecified cellulitis site, unspecified extremity site, unspecified laterality - linezolid (ZYVOX) 600 MG tablet; Take 1 tab PO every 12 hrs for 10 days  Dispense: 20 tablet; Refill: 0 Discussed case with pharmacist Arline Asp(Cindy).  Cellulites presumably MRSA given already on Keflex.  Pt gets rash with sulfa; doxy contraindicated in pregnancy.  Vanc has low tissue penetration.  Liezolid next best option   Limited data in pregnancy but benefits outweigh risks.  Discussed above with patient and husband who agree on treatment.  If not better in 48 hours, must be seen in office.  If worsens, must be seen.    Preterm labor symptoms and general obstetric precautions including but not limited to vaginal bleeding, contractions, leaking of fluid and fetal movement were reviewed in detail with the patient. Please refer to After Visit Summary for other counseling recommendations.  No Follow-up on file.   Lesly DukesKelly H Lizzett Nobile, MD

## 2015-02-16 ENCOUNTER — Encounter: Payer: Medicaid Other | Admitting: Certified Nurse Midwife

## 2015-02-19 ENCOUNTER — Encounter: Payer: Self-pay | Admitting: *Deleted

## 2015-04-06 ENCOUNTER — Other Ambulatory Visit: Payer: Self-pay | Admitting: Obstetrics & Gynecology

## 2015-04-06 ENCOUNTER — Ambulatory Visit (HOSPITAL_COMMUNITY)
Admission: RE | Admit: 2015-04-06 | Discharge: 2015-04-06 | Disposition: A | Discharge status: Home / Self Care | Payer: Medicaid Other | Source: Ambulatory Visit | Attending: Obstetrics & Gynecology | Admitting: Obstetrics & Gynecology

## 2015-04-06 DIAGNOSIS — Z3491 Encounter for supervision of normal pregnancy, unspecified, first trimester: Secondary | ICD-10-CM

## 2015-04-06 DIAGNOSIS — Z3689 Encounter for other specified antenatal screening: Secondary | ICD-10-CM

## 2015-04-06 DIAGNOSIS — Z36 Encounter for antenatal screening of mother: Secondary | ICD-10-CM | POA: Diagnosis present

## 2015-04-06 DIAGNOSIS — Z3482 Encounter for supervision of other normal pregnancy, second trimester: Secondary | ICD-10-CM

## 2015-04-06 DIAGNOSIS — Z3A19 19 weeks gestation of pregnancy: Secondary | ICD-10-CM

## 2015-04-13 ENCOUNTER — Ambulatory Visit (INDEPENDENT_AMBULATORY_CARE_PROVIDER_SITE_OTHER): Payer: Medicaid Other | Admitting: Certified Nurse Midwife

## 2015-04-13 VITALS — BP 119/62 | HR 89 | Wt 124.0 lb

## 2015-04-13 DIAGNOSIS — O09292 Supervision of pregnancy with other poor reproductive or obstetric history, second trimester: Secondary | ICD-10-CM

## 2015-04-13 DIAGNOSIS — Z3482 Encounter for supervision of other normal pregnancy, second trimester: Secondary | ICD-10-CM

## 2015-04-13 NOTE — Patient Instructions (Signed)
Glucose Tolerance Test During Pregnancy The glucose tolerance test (GTT) is a blood test used to determine if you have developed a type of diabetes during pregnancy (gestational diabetes). This is when your body does not properly process sugar (glucose) in the food you eat, resulting in high blood glucose levels. Typically, a GTT is done after you have had a 1-hour glucose test with results that indicate you possibly have gestational diabetes. It may also be done if:  You have a history of giving birth to very large babies or have experienced repeated fetal loss (stillbirth).   You have signs and symptoms of diabetes, such as:   Changes in your vision.   Tingling or numbness in your hands or feet.   Changes in hunger, thirst, and urination not otherwise explained by your pregnancy.  The GTT lasts about 3 hours. You will be given a sugar-water solution to drink at the beginning of the test. You will have blood drawn before you drink the solution and then again 1, 2, and 3 hours after you drink it. You will not be allowed to eat or drink anything else during the test. You must remain at the testing location to make sure that your blood is drawn on time. You should also avoid exercising during the test, because exercise can alter test results. PREPARATION FOR TEST  Eat normally for 3 days prior to the GTT test, including having plenty of carbohydrate-rich foods. Do not eat or drink anything except water during the final 12 hours before the test. In addition, your health care provider may ask you to stop taking certain medicines before the test. RESULTS  It is your responsibility to obtain your test results. Ask the lab or department performing the test when and how you will get your results. Contact your health care provider to discuss any questions you have about your results.  Range of Normal Values Ranges for normal values may vary among different labs and hospitals. You should always check  with your health care provider after having lab work or other tests done to discuss whether your values are considered within normal limits. Normal levels of blood glucose are as follows:  Fasting: less than 105 mg/dL.   1 hour after drinking the solution: less than 190 mg/dL.   2 hours after drinking the solution: less than 165 mg/dL.   3 hours after drinking the solution: less than 145 mg/dL.  Some substances can interfere with GTT results. These may include:  Blood pressure and heart failure medicines, including beta blockers, furosemide, and thiazides.   Anti-inflammatory medicines, including aspirin.   Nicotine.   Some psychiatric medicines.  Meaning of Results Outside Normal Value Ranges GTT test results that are above normal values may indicate a number of health problems, such as:   Gestational diabetes.   Acute stress response.   Cushing syndrome.   Tumors such as pheochromocytoma or glucagonoma.   Long-term kidney problems.   Pancreatitis.   Hyperthyroidism.   Current infection.  Discuss your test results with your health care provider. He or she will use the results to make a diagnosis and determine a treatment plan that is right for you.   This information is not intended to replace advice given to you by your health care provider. Make sure you discuss any questions you have with your health care provider.   Document Released: 07/19/2011 Document Revised: 02/07/2014 Document Reviewed: 05/24/2013 Elsevier Interactive Patient Education 2016 [employer]  

## 2015-04-13 NOTE — Progress Notes (Signed)
Subjective:  Jennifer LisHeather J Ryan is a 30 y.o. G2P1001 at 2766w1d being seen today for ongoing prenatal care.  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  Patient reports no complaints.  Contractions: Not present. Vag. Bleeding: None.  Movement: Present. Denies leaking of fluid.   The following portions of the patient's history were reviewed and updated as appropriate: allergies, current medications, past family history, past medical history, past social history, past surgical history and problem list. Problem list updated.  Objective:   Filed Vitals:   04/13/15 1034  BP: 119/62  Pulse: 89  Weight: 124 lb (56.246 kg)    Fetal Status: Fetal Heart Rate (bpm): 148   Movement: Present     General:  Alert, oriented and cooperative. Patient is in no acute distress.  Skin: Skin is warm and dry. No rash noted.   Cardiovascular: Normal heart rate noted  Respiratory: Normal respiratory effort, no problems with respiration noted  Abdomen: Soft, gravid, appropriate for gestational age. Pain/Pressure: Absent     Pelvic: Vag. Bleeding: None Vag D/C Character: Thick   Cervical exam deferred        Extremities: Normal range of motion.  Edema: None  Mental Status: Normal mood and affect. Normal behavior. Normal judgment and thought content.   Urinalysis: Urine Protein: Negative Urine Glucose: Negative  Assessment and Plan:  Pregnancy: G2P1001 at 5266w1d  1. Hx maternal GBS (group B streptococcus) affected neonate, pregnant, second trimester   2. Encounter for supervision of other normal pregnancy in second trimester   Preterm labor symptoms and general obstetric precautions including but not limited to vaginal bleeding, contractions, leaking of fluid and fetal movement were reviewed in detail  with the patient. Please refer to After Visit Summary for other counseling recommendations.  No Follow-up on file.   Jennifer Ryan, CNM

## 2015-05-06 ENCOUNTER — Ambulatory Visit (INDEPENDENT_AMBULATORY_CARE_PROVIDER_SITE_OTHER): Payer: Medicaid Other | Admitting: Obstetrics & Gynecology

## 2015-05-06 VITALS — BP 128/67 | HR 98 | Wt 126.0 lb

## 2015-05-06 DIAGNOSIS — Z3482 Encounter for supervision of other normal pregnancy, second trimester: Secondary | ICD-10-CM

## 2015-05-06 DIAGNOSIS — O09292 Supervision of pregnancy with other poor reproductive or obstetric history, second trimester: Secondary | ICD-10-CM

## 2015-05-06 DIAGNOSIS — Z141 Cystic fibrosis carrier: Secondary | ICD-10-CM

## 2015-05-06 NOTE — Progress Notes (Signed)
Subjective:  Jennifer Ryan is a 30 y.o. G2P1001 at 1034w3d (503 yo son) being seen today for ongoing prenatal care.  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  Patient reports She has had a cough and cold symptoms, has not tried any OTC meds yet. She is not feelling fetal movements yet..   Lockie Pares. Vag. Bleeding: None.  Movement: Absent. Denies leaking of fluid.   The following portions of the patient's history were reviewed and updated as appropriate: allergies, current medications, past family history, past medical history, past social history, past surgical history and problem list. Problem list updated.  Objective:   Filed Vitals:   05/06/15 0924  BP: 128/67  Pulse: 98  Weight: 126 lb (57.153 kg)    Fetal Status: Fetal Heart Rate (bpm): 156   Movement: Absent     General:  Alert, oriented and cooperative. Patient is in no acute distress.  Skin: Skin is warm and dry. No rash noted.   Cardiovascular: Normal heart rate noted  Respiratory: Normal respiratory effort, no problems with respiration noted  Abdomen: Soft, gravid, appropriate for gestational age. Pain/Pressure: Absent     Pelvic: Vag. Bleeding: None Vag D/C Character: Thin   Cervical exam deferred        Extremities: Normal range of motion.  Edema: None  Mental Status: Normal mood and affect. Normal behavior. Normal judgment and thought content.   Urinalysis: Urine Protein: Negative Urine Glucose: Negative Bedside u/s shows fetal movement. She felt reassured. Assessment and Plan:  Pregnancy: G2P1001 at 9834w3d  1. Encounter for supervision of other normal pregnancy in second trimester   2. Hx maternal GBS (group B streptococcus) affected neonate, pregnant, second trimester   3. Cystic fibrosis gene carrier  I have  recommended OTC cold remedies.  Preterm labor symptoms and general obstetric precautions including but not limited to vaginal bleeding, contractions, leaking of fluid and fetal movement were reviewed in detail with the patient. Please refer to After Visit Summary for other counseling recommendations.  No Follow-up on file.   Allie BossierMyra C Arul Farabee, MD

## 2015-05-11 ENCOUNTER — Ambulatory Visit (INDEPENDENT_AMBULATORY_CARE_PROVIDER_SITE_OTHER): Payer: Medicaid Other | Admitting: Osteopathic Medicine

## 2015-05-11 ENCOUNTER — Encounter: Payer: Self-pay | Admitting: Osteopathic Medicine

## 2015-05-11 ENCOUNTER — Ambulatory Visit (INDEPENDENT_AMBULATORY_CARE_PROVIDER_SITE_OTHER): Payer: Medicaid Other

## 2015-05-11 VITALS — BP 115/60 | HR 80 | Temp 98.0°F | Wt 127.0 lb

## 2015-05-11 DIAGNOSIS — R0989 Other specified symptoms and signs involving the circulatory and respiratory systems: Secondary | ICD-10-CM | POA: Diagnosis not present

## 2015-05-11 DIAGNOSIS — R05 Cough: Secondary | ICD-10-CM

## 2015-05-11 DIAGNOSIS — J189 Pneumonia, unspecified organism: Secondary | ICD-10-CM

## 2015-05-11 DIAGNOSIS — R059 Cough, unspecified: Secondary | ICD-10-CM

## 2015-05-11 MED ORDER — HYDROCODONE-HOMATROPINE 5-1.5 MG/5ML PO SYRP: 5.0000 mL | ORAL_SOLUTION | Freq: Four times a day (QID) | ORAL | Status: DC | PRN

## 2015-05-11 MED ORDER — METHYLPREDNISOLONE 4 MG PO TBPK: ORAL_TABLET | ORAL | Status: DC

## 2015-05-11 MED ORDER — AZITHROMYCIN 250 MG PO TABS: ORAL_TABLET | ORAL | Status: DC

## 2015-05-11 MED ORDER — ALBUTEROL SULFATE HFA 108 (90 BASE) MCG/ACT IN AERS: 2.0000 | INHALATION_SPRAY | Freq: Four times a day (QID) | RESPIRATORY_TRACT | Status: DC | PRN

## 2015-05-11 MED ORDER — FLUTICASONE-SALMETEROL 115-21 MCG/ACT IN AERO: 2.0000 | INHALATION_SPRAY | Freq: Two times a day (BID) | RESPIRATORY_TRACT | Status: DC

## 2015-05-11 NOTE — Patient Instructions (Signed)
Fill steroid dose pack if your cough is still bad after the antibiotics - we may also need to have you come in for a recheck if you're not better with antibiotic treatment. Caution with the cough syrup - use as needed, there is risk of fetal withdrawal when hydrocodone is used long-term but for as-needed cough treatment there shouldn't be an issue. Use albuterol inhaler as needed for wheezing or feeling of shortness of breath, this may help clear mucus and help you cough. Fill the steroid inhaler if your cough persists and is bothersome - mild nagging cough can linger a few weeks after pneumonia or viral illness. Otherwise, honey and herbal tea is good for cough, can use lozenges with menthol or benzocaine. See me if cough is worse or if you are feeling more short of breath!

## 2015-05-11 NOTE — Progress Notes (Signed)
HPI: Jennifer Ryan is a 30 y.o. female who [facility] today for chief complaint of:  Chief Complaint  Patient presents with  . Establish Care    COUGH    ILLNESS . Quality: coughing,  . Duration: about a week . Timing: constant . Modifying factors: delsym, Robitussin, tylenol cold and but other OTC, nothing helped.  . Assoc signs/symptoms: no real sore throat, minimal sinus problems. Fever 100.1 about a week ago. Last week was feeling SOB. 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    Past medical, social and family history reviewed: Past Medical History  Diagnosis Date  . No pertinent past medical history    Past Surgical History  Procedure Laterality Date  . Wisdom tooth extraction  2007   Social History  Substance Use Topics  . Smoking status: Never Smoker   . Smokeless tobacco: Never Used  . Alcohol Use: No   Family History  Problem Relation Age of Onset  . Prostate cancer Paternal Grandfather   . Brain cancer      pat great grandfather  . Heart attack Paternal Uncle   . Depression Maternal Aunt   . Diabetes      cousins   . Hyperlipidemia    . Hypertension Paternal Grandfather   . Hypertension Paternal Grandmother     Current Outpatient Prescriptions  Medication Sig Dispense Refill  . Prenatal Vit-Fe Fumarate-FA (PRENATAL MULTIVITAMIN) TABS Take 1 tablet by mouth at bedtime.     No current facility-administered medications for this visit.   Allergies  Allergen Reactions  . Bactrim [Sulfamethoxazole-Trimethoprim] Rash  . Penicillins Rash  . Sulfa Antibiotics Rash      Review of Systems: CONSTITUTIONAL:  (+) fever, no chills, No  unintentional weight changes HEAD/EYES/EARS/NOSE/THROAT: (+) headache, no vision change, no hearing change, No  sore throat, No  sinus pressure CARDIAC: No  chest pain, No  pressure, No  palpitations, No  orthopnea RESPIRATORY: (+) cough, (+) shortness of breath/wheeze GASTROINTESTINAL: No  nausea, No  vomiting, No  abdominal pain, No  blood in stool, No  diarrhea, No  constipation  MUSCULOSKELETAL: No  myalgia/arthralgia GENITOURINARY: (+) incontinence, No  abnormal genital bleeding/discharge SKIN: No  rash/wounds/concerning lesions HEM/ONC: No  easy bruising/bleeding, No  abnormal lymph node ENDOCRINE: No polyuria/polydipsia/polyphagia, No  heat/cold intolerance  NEUROLOGIC: No  weakness, No  dizziness, No  slurred speech PSYCHIATRIC: No  concerns with depression, No  concerns with anxiety, No sleep problems, Neg PHQ2  Exam:  BP 115/60 mmHg  Pulse 80  Temp(Src) 98 F (36.7 C) (Oral)  Wt 127 lb (57.607 kg)  LMP 11/23/2014 Constitutional: VS see above. General Appearance: alert, well-developed, well-nourished, NAD Eyes: Normal lids and conjunctive, non-icteric sclera,  Ears, Nose, Mouth, Throat: MMM, Normal external inspection ears/nares/mouth/lips/gums, TM normal bilaterally. Pharynx no erythema, no exudate.  Neck: No masses, trachea midline. No thyroid enlargement/tenderness/mass appreciated. No lymphadenopathy Respiratory: Normal respiratory effort. no wheeze, no rhonchi, no rales. Faint wheeze LLL on deep inspiration but this resolves on repeat listen, coughing Cardiovascular: S1/S2 normal, no murmur, no rub/gallop auscultated. RRR. No lower extremity edema. Gastrointestinal: Gravid.  Musculoskeletal: Gait normal. No clubbing/cyanosis of digits.  Neurological: No cranial nerve deficit on limited exam. Motor and sensation intact and symmetric Skin: warm, dry, intact. No rash/ulcer. Psychiatric: Normal judgment/insight. Normal mood and affect. Oriented x3.    No results found for  this or any previous visit (from the past 72 hour(s)).  Dg Chest 2 View  05/11/2015  CLINICAL DATA:  Cough and chest congestion and fever over the past week, patient is currently  pregnant. EXAM: CHEST  2 VIEW COMPARISON:  None in PACs FINDINGS: The lungs are well-expanded. The lung markings are coarse in the retrocardiac region on the left. The heart and pulmonary vascularity are normal. The mediastinum is normal in width. The trachea is midline. IMPRESSION: Probable subsegmental atelectasis or early pneumonia in the left lower lobe. Otherwise the chest is unremarkable. Electronically Signed   By: David  [place] M.D.   On: 05/11/2015 10:45    Images personally reviewed.    ASSESSMENT/PLAN: Medrol pack if no better on day 4 - 5 of abx, fill advair if cough persists >1 week, CALL IF QUESTIONS OR IF WORSE. ER/RTC precautions reviewed. Caution with meds - risk/vs benefits of use in pregnancy reviewed in detail, should not cause any problems with short-term use but if any concerns let me or OB know asap.   CAP (community acquired pneumonia) - Plan: azithromycin (ZITHROMAX) 250 MG tablet, albuterol (PROVENTIL HFA;VENTOLIN HFA) 108 (90 Base) MCG/ACT inhaler  Cough - Plan: DG Chest 2 View, fluticasone-salmeterol (ADVAIR HFA) 115-21 MCG/ACT inhaler, HYDROcodone-homatropine (HYCODAN) 5-1.5 MG/5ML syrup, methylPREDNISolone (MEDROL DOSEPAK) 4 MG TBPK tablet     Return if symptoms worsen or fail to improve.

## 2015-05-14 ENCOUNTER — Telehealth: Payer: Self-pay | Admitting: *Deleted

## 2015-05-14 NOTE — Telephone Encounter (Signed)
PA initiated and approved for advair inhaler.through [facility] aprroved for 24 months [id number]  Call Id # [id number]  Pt notified and pharm notified

## 2015-06-08 ENCOUNTER — Ambulatory Visit (INDEPENDENT_AMBULATORY_CARE_PROVIDER_SITE_OTHER): Payer: Medicaid Other | Admitting: Advanced Practice Midwife

## 2015-06-08 ENCOUNTER — Encounter: Payer: Self-pay | Admitting: Advanced Practice Midwife

## 2015-06-08 VITALS — BP 127/71 | Wt 130.0 lb

## 2015-06-08 DIAGNOSIS — Z23 Encounter for immunization: Secondary | ICD-10-CM

## 2015-06-08 DIAGNOSIS — Z36 Encounter for antenatal screening of mother: Secondary | ICD-10-CM | POA: Diagnosis not present

## 2015-06-08 DIAGNOSIS — Z3482 Encounter for supervision of other normal pregnancy, second trimester: Secondary | ICD-10-CM

## 2015-06-08 DIAGNOSIS — Z3492 Encounter for supervision of normal pregnancy, unspecified, second trimester: Secondary | ICD-10-CM

## 2015-06-08 NOTE — Patient Instructions (Signed)

## 2015-06-08 NOTE — Progress Notes (Signed)
Subjective:  Jennifer Ryan is a 30 y.o. G2P1001 at 7365w1d being seen today for ongoing prenatal care.  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  Patient reports no complaints.   .  .   . Denies leaking of fluid.   The following portions of the patient's history were reviewed and updated as appropriate: allergies, current medications, past family history, past medical history, past social history, past surgical history and problem list. Problem list updated.  Objective:   Filed Vitals:   06/08/15 1039  BP: 127/71  Weight: 130 lb (58.968 kg)    Fetal Status:           General:  Alert, oriented and cooperative. Patient is in no acute distress.  Skin: Skin is warm and dry. No rash noted.   Cardiovascular: Normal heart rate noted  Respiratory: Normal respiratory effort, no problems with respiration noted  Abdomen: Soft, gravid, appropriate for gestational age.       Pelvic:       Cervical exam deferred        Extremities: Normal range of motion.     Mental Status: Normal mood and affect. Normal behavior. Normal judgment and thought content.   Urinalysis:      Assessment and Plan:  Pregnancy: G2P1001 at 2565w1d  1. Normal pregnancy in second trimester      Reviewed Glucola today. - Glucose Tolerance, 1 HR (50g) - CBC - RPR - HIV antibody (with reflex) - Tdap vaccine greater than or equal to 7yo IM  2. Encounter for supervision of other normal pregnancy in second trimester      Having trouble with BP cuff for babyscripts.  Encouraged to call hotline, may need new cuff.   Preterm labor symptoms and general obstetric precautions including but not limited to vaginal bleeding, contractions, leaking of fluid and fetal movement were reviewed in detail with the patient. Please  refer to After Visit Summary for other counseling recommendations.  RTO 2 weeks or PRN  Aviva SignsMarie L Williams, CNM

## 2015-06-09 ENCOUNTER — Telehealth: Payer: Self-pay | Admitting: *Deleted

## 2015-06-09 LAB — CBC
HEMATOCRIT: 31.7 % — AB (ref 35.0–45.0)
Hemoglobin: 10.4 g/dL — ABNORMAL LOW (ref 11.7–15.5)
MCH: 28.9 pg (ref 27.0–33.0)
MCHC: 32.8 g/dL (ref 32.0–36.0)
MCV: 88.1 fL (ref 80.0–100.0)
MPV: 9 fL (ref 7.5–12.5)
Platelets: 247 10*3/uL (ref 140–400)
RBC: 3.6 MIL/uL — AB (ref 3.80–5.10)
RDW: 13.6 % (ref 11.0–15.0)
WBC: 7.1 10*3/uL (ref 3.8–10.8)

## 2015-06-09 LAB — RPR

## 2015-06-09 LAB — HIV ANTIBODY (ROUTINE TESTING W REFLEX): HIV 1&2 Ab, 4th Generation: NONREACTIVE

## 2015-06-09 LAB — GLUCOSE TOLERANCE, 1 HOUR (50G) W/O FASTING: GLUCOSE, 1 HR, GESTATIONAL: 113 mg/dL (ref <140)

## 2015-06-09 NOTE — Telephone Encounter (Signed)
LM on voicemail of normal 1 hr GTT but that her Hgb was slightly down and recommended adding an Iron supplement with it.  I also ask her to call office about her baby scripts activity and why she hasn't been using her devices.  I explained that we need to know one way or another as to her status..  Pt has just called the office and states that her B/P cuff is getting totally different readings when she takes it.  She states that she had emailed [employer] about the problem but has never heard anything.  According to [employer] they have reached out to her several times and left messages but she has not returned their calls.

## 2015-07-06 ENCOUNTER — Ambulatory Visit (INDEPENDENT_AMBULATORY_CARE_PROVIDER_SITE_OTHER): Payer: Medicaid Other | Admitting: Advanced Practice Midwife

## 2015-07-06 VITALS — BP 110/60 | HR 87 | Wt 133.0 lb

## 2015-07-06 DIAGNOSIS — Z3483 Encounter for supervision of other normal pregnancy, third trimester: Secondary | ICD-10-CM

## 2015-07-06 NOTE — Progress Notes (Signed)
Subjective:  Jennifer Ryan is a 30 y.o. G2P1001 at 3582w1d being seen today for ongoing prenatal care.  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  Patient reports no complaints.  Contractions: Irritability. Vag. Bleeding: None.  Movement: Present. Denies leaking of fluid.   The following portions of the patient's history were reviewed and updated as appropriate: allergies, current medications, past family history, past medical history, past social history, past surgical history and problem list. Problem list updated.  Objective:   Filed Vitals:   07/06/15 1044  BP: 110/60  Pulse: 87  Weight: 133 lb (60.328 kg)    Fetal Status: Fetal Heart Rate (bpm): 145 Fundal Height: 21 cm Movement: Present     General:  Alert, oriented and cooperative. Patient is in no acute distress.  Skin: Skin is warm and dry. No rash noted.   Cardiovascular: Normal heart rate noted  Respiratory: Normal respiratory effort, no problems with respiration noted  Abdomen: Soft, gravid, appropriate for gestational age. Pain/Pressure: Absent     Pelvic: Vag. Bleeding: None Vag D/C Character: Thin   Cervical exam deferred        Extremities: Normal range of motion.  Edema: None  Mental Status: Normal mood and affect. Normal behavior. Normal judgment and thought content.   Urinalysis: Urine Protein: Negative Urine Glucose: Negative  Assessment and Plan:  Pregnancy: G2P1001 at 5982w1d  1. Encounter for supervision of other normal pregnancy in third trimester   Preterm labor symptoms and general obstetric precautions including but not limited to vaginal bleeding, contractions, leaking of fluid and fetal movement were reviewed in detail with the patient. Please refer to After Visit Summary for other counseling  recommendations.  Return in about 2 weeks (around 07/20/2015).   Dorathy KinsmanVirginia Keslee Harrington, CNM

## 2015-07-06 NOTE — Patient Instructions (Signed)

## 2015-07-20 ENCOUNTER — Ambulatory Visit (INDEPENDENT_AMBULATORY_CARE_PROVIDER_SITE_OTHER): Payer: Medicaid Other | Admitting: Obstetrics & Gynecology

## 2015-07-20 VITALS — BP 116/62 | HR 85 | Wt 137.0 lb

## 2015-07-20 DIAGNOSIS — Z3482 Encounter for supervision of other normal pregnancy, second trimester: Secondary | ICD-10-CM

## 2015-07-20 DIAGNOSIS — O09292 Supervision of pregnancy with other poor reproductive or obstetric history, second trimester: Secondary | ICD-10-CM

## 2015-07-20 NOTE — Progress Notes (Signed)
Subjective:  Jennifer Ryan is a 30 y.o. G2P1001 at 324w1d being seen today for ongoing prenatal care.  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  Patient reports no complaints.  Contractions: Irritability. Vag. Bleeding: None.  Movement: Present. Denies leaking of fluid.   The following portions of the patient's history were reviewed and updated as appropriate: allergies, current medications, past family history, past medical history, past social history, past surgical history and problem list. Problem list updated.  Objective:   Filed Vitals:   07/20/15 1320  BP: 116/62  Pulse: 85  Weight: 137 lb (62.143 kg)    Fetal Status: Fetal Heart Rate (bpm): 140 Fundal Height: 33 cm Movement: Present     General:  Alert, oriented and cooperative. Patient is in no acute distress.  Skin: Skin is warm and dry. No rash noted.   Cardiovascular: Normal heart rate noted  Respiratory: Normal respiratory effort, no problems with respiration noted  Abdomen: Soft, gravid, appropriate for gestational age. Pain/Pressure: Absent     Pelvic: Cervical exam deferred        Extremities: Normal range of motion.  Edema: Trace  Mental Status: Normal mood and affect. Normal behavior. Normal judgment and thought content.   Urinalysis: Urine Protein: Trace Urine Glucose: Negative  Assessment and Plan:  Pregnancy: G2P1001 at 374w1d  1. Encounter for supervision of other normal pregnancy in second trimester Routine care; no issues today  2. Hx maternal GBS (group B streptococcus) affected neonate, pregnant, second trimester Pt has hitory of hives with PCN. She is candidate for cephalosporin.  Preterm labor symptoms and general obstetric precautions including but not limited to vaginal bleeding,  contractions, leaking of fluid and fetal movement were reviewed in detail with the patient. Please refer to After Visit Summary for other counseling recommendations.  Return in about 2 weeks (around 08/03/2015).   Lesly DukesKelly H Aalyah Mansouri, MD

## 2015-08-03 ENCOUNTER — Ambulatory Visit (INDEPENDENT_AMBULATORY_CARE_PROVIDER_SITE_OTHER): Payer: Medicaid Other | Admitting: Obstetrics & Gynecology

## 2015-08-03 ENCOUNTER — Other Ambulatory Visit (HOSPITAL_COMMUNITY)
Admission: RE | Admit: 2015-08-03 | Discharge: 2015-08-03 | Disposition: A | Discharge status: Home / Self Care | Payer: Medicaid Other | Source: Ambulatory Visit | Attending: Obstetrics & Gynecology | Admitting: Obstetrics & Gynecology

## 2015-08-03 VITALS — BP 125/72 | HR 89 | Wt 138.0 lb

## 2015-08-03 DIAGNOSIS — Z36 Encounter for antenatal screening of mother: Secondary | ICD-10-CM

## 2015-08-03 DIAGNOSIS — Z3483 Encounter for supervision of other normal pregnancy, third trimester: Secondary | ICD-10-CM

## 2015-08-03 DIAGNOSIS — Z113 Encounter for screening for infections with a predominantly sexual mode of transmission: Secondary | ICD-10-CM

## 2015-08-03 DIAGNOSIS — O09293 Supervision of pregnancy with other poor reproductive or obstetric history, third trimester: Secondary | ICD-10-CM

## 2015-08-03 LAB — OB RESULTS CONSOLE GBS: GBS: POSITIVE

## 2015-08-03 NOTE — Patient Instructions (Signed)
Return to clinic for any scheduled appointments or obstetric concerns, or go to [facility] for evaluation  

## 2015-08-03 NOTE — Progress Notes (Signed)
Subjective:  Jennifer Ryan is a 30 y.o. G2P1001 at 2827w1d being seen today for ongoing prenatal care.  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  Patient reports occasional contractions.  Contractions: Irregular. Vag. Bleeding: None.  Movement: Present. Denies leaking of fluid.   The following portions of the patient's history were reviewed and updated as appropriate: allergies, current medications, past family history, past medical history, past social history, past surgical history and problem list. Problem list updated.  Objective:   Filed Vitals:   08/03/15 1515  BP: 125/72  Pulse: 89  Weight: 138 lb (62.596 kg)    Fetal Status: Fetal Heart Rate (bpm): 138 Fundal Height: 36 cm Movement: Present  Presentation: Vertex  General:  Alert, oriented and cooperative. Patient is in no acute distress.  Skin: Skin is warm and dry. No rash noted.   Cardiovascular: Normal heart rate noted  Respiratory: Normal respiratory effort, no problems with respiration noted  Abdomen: Soft, gravid, appropriate for gestational age. Pain/Pressure: Present     Pelvic: Cervical exam performed Dilation: Closed Effacement (%): 50 Station: -3  Extremities: Normal range of motion.  Edema: Trace  Mental Status: Normal mood and affect. Normal behavior. Normal judgment and thought content.   Urinalysis: Urine Protein: Negative Urine Glucose: Negative  Assessment and Plan:  Pregnancy: G2P1001 at 1127w1d  1. Hx maternal GBS (group B streptococcus) affected neonate, pregnant, third trimester - Culture, Grp B Strep w/Rflx Suscept Hx GBS infant with sepsis, treat in labor regardless GBS culture done at 36 weeks for sensitivities, will still treat even if negative.  2. Encounter for supervision of other  normal pregnancy in third trimester - GC/Chlamydia probe amp ([place]not at [facility] Preterm labor symptoms and general obstetric precautions including but not limited to vaginal bleeding, contractions, leaking of fluid and fetal movement were reviewed in detail with the patient. Please refer to After Visit Summary for other counseling recommendations.  Return in about 2 weeks (around 08/17/2015) for OB Visit.   Tereso NewcomerUgonna A Kaysi Ourada, MD

## 2015-08-06 LAB — GC/CHLAMYDIA PROBE AMP (~~LOC~~) NOT AT ARMC
Chlamydia: NEGATIVE
NEISSERIA GONORRHEA: NEGATIVE

## 2015-08-07 ENCOUNTER — Encounter: Payer: Self-pay | Admitting: Obstetrics & Gynecology

## 2015-08-07 DIAGNOSIS — O99820 Streptococcus B carrier state complicating pregnancy: Secondary | ICD-10-CM | POA: Insufficient documentation

## 2015-08-08 LAB — CULTURE, STREPTOCOCCUS GRP B W/SUSCEPT

## 2015-08-10 ENCOUNTER — Ambulatory Visit (INDEPENDENT_AMBULATORY_CARE_PROVIDER_SITE_OTHER): Payer: Medicaid Other | Admitting: Certified Nurse Midwife

## 2015-08-10 ENCOUNTER — Encounter: Payer: Self-pay | Admitting: Certified Nurse Midwife

## 2015-08-10 VITALS — BP 125/74 | HR 82 | Wt 137.0 lb

## 2015-08-10 DIAGNOSIS — O09293 Supervision of pregnancy with other poor reproductive or obstetric history, third trimester: Secondary | ICD-10-CM

## 2015-08-10 DIAGNOSIS — Z3483 Encounter for supervision of other normal pregnancy, third trimester: Secondary | ICD-10-CM

## 2015-08-10 DIAGNOSIS — O99820 Streptococcus B carrier state complicating pregnancy: Secondary | ICD-10-CM

## 2015-08-10 DIAGNOSIS — Z22330 Carrier of Group B streptococcus: Secondary | ICD-10-CM

## 2015-08-10 NOTE — Progress Notes (Signed)
Subjective:  Jennifer Ryan is a 30 y.o. G2P1001 at 557w1d being seen today for ongoing prenatal care.  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  Patient reports no complaints.  Contractions: Irregular. Vag. Bleeding: None.  Movement: Present. Denies leaking of fluid.   The following portions of the patient's history were reviewed and updated as appropriate: allergies, current medications, past family history, past medical history, past social history, past surgical history and problem list. Problem list updated.  Objective:   Filed Vitals:   08/10/15 1038  BP: 125/74  Pulse: 82  Weight: 137 lb (62.143 kg)    Fetal Status: Fetal Heart Rate (bpm): 131   Movement: Present     General:  Alert, oriented and cooperative. Patient is in no acute distress.  Skin: Skin is warm and dry. No rash noted.   Cardiovascular: Normal heart rate noted  Respiratory: Normal respiratory effort, no problems with respiration noted  Abdomen: Soft, gravid, appropriate for gestational age. Pain/Pressure: Present     Pelvic: Vag. Bleeding: None Vag D/C Character: Thin   Cervical exam deferred        Extremities: Normal range of motion.  Edema: None  Mental Status: Normal mood and affect. Normal behavior. Normal judgment and thought content.   Urinalysis: Urine Protein: Negative Urine Glucose: Negative  Assessment and Plan:  Pregnancy: G2P1001 at [redacted]w[redacted]d  1. Encounter for supervision of other normal pregnancy in third trimester  2. Hx maternal GBS (group B streptococcus) affected neonate, pregnant, third trimester  3. Group B Streptococcus carrier, +RV culture, currently pregnant -Abx in labor, Clinda sensive  Term labor symptoms and  general obstetric precautions including but not limited to vaginal bleeding, contractions, leaking of fluid and fetal movement were reviewed in detail with the patient. Please refer to After Visit Summary for other counseling recommendations.  Return in about 1 week (around 08/17/2015).   Donette LarryMelanie Roselene Gray, CNM

## 2015-08-17 ENCOUNTER — Ambulatory Visit (INDEPENDENT_AMBULATORY_CARE_PROVIDER_SITE_OTHER): Payer: Medicaid Other | Admitting: Advanced Practice Midwife

## 2015-08-17 VITALS — BP 126/68 | HR 79 | Wt 138.0 lb

## 2015-08-17 DIAGNOSIS — O99820 Streptococcus B carrier state complicating pregnancy: Secondary | ICD-10-CM

## 2015-08-17 DIAGNOSIS — Z22330 Carrier of Group B streptococcus: Secondary | ICD-10-CM

## 2015-08-17 DIAGNOSIS — Z3483 Encounter for supervision of other normal pregnancy, third trimester: Secondary | ICD-10-CM

## 2015-08-17 DIAGNOSIS — O09293 Supervision of pregnancy with other poor reproductive or obstetric history, third trimester: Secondary | ICD-10-CM

## 2015-08-17 NOTE — Progress Notes (Signed)
Subjective:  Jennifer Ryan is a 30 y.o. G2P1001 at [redacted]w[redacted]d being seen today for ongoing prenatal care.  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  Patient reports occasional contractions.  Contractions: Irregular. Vag. Bleeding: None.  Movement: Present. Denies leaking of fluid.   The following portions of the patient's history were reviewed and updated as appropriate: allergies, current medications, past family history, past medical history, past social history, past surgical history and problem list. Problem list updated.  Objective:   Filed Vitals:   08/17/15 1054  BP: 126/68  Pulse: 79  Weight: 138 lb (62.596 kg)    Fetal Status: Fetal Heart Rate (bpm): 135 Fundal Height: 38 cm Movement: Present  Presentation: Vertex  General:  Alert, oriented and cooperative. Patient is in no acute distress.  Skin: Skin is warm and dry. No rash noted.   Cardiovascular: Normal heart rate noted  Respiratory: Normal respiratory effort, no problems with respiration noted  Abdomen: Soft, gravid, appropriate for gestational age. Pain/Pressure: Present     Pelvic:  Cervical exam performed Dilation: 1.5 Effacement (%): Thick Station: -3  Extremities: Normal range of motion.  Edema: None  Mental Status: Normal mood and affect. Normal behavior. Normal judgment and thought content.   Urinalysis: Urine Protein: Negative Urine Glucose: Negative  Assessment and Plan:  Pregnancy: G2P1001 at 558w1d  1. Group B Streptococcus carrier, +RV culture, currently pregnant   2. Hx maternal GBS (group B streptococcus) affected neonate, pregnant, third trimester   3. Encounter for supervision of other normal pregnancy in third  trimester   Term labor symptoms and general obstetric precautions including but not limited to vaginal bleeding, contractions, leaking of fluid and fetal movement were reviewed in detail with the patient. Please refer to After Visit Summary for other counseling recommendations.  Return in about 1 week (around 08/24/2015).   Dorathy KinsmanVirginia Daquavion Catala, CNM

## 2015-08-17 NOTE — Patient Instructions (Signed)
Group B streptococcus (GBS) is a type of bacteria often found in healthy women. GBS is not the same as the bacteria that causes strep throat. You may have GBS in your vagina, rectum, or bladder. GBS does not spread through sexual contact, but it can be passed to a baby during childbirth. This can be dangerous for your baby. It is not dangerous to you and usually does not cause any symptoms. Your health care provider may test you for GBS when your pregnancy is between 35 and 37 weeks. GBS is dangerous only during birth, so there is no need to test for it earlier. It is possible to have GBS during pregnancy and never pass it to your baby. If your test results are positive for GBS, your health care provider may recommend giving you antibiotic medicine during delivery to make sure your baby stays healthy. RISK FACTORS You are more likely to pass GBS to your baby if:   Your water breaks (ruptured membrane) or you go into labor before 37 weeks.  Your water breaks 18 hours before you deliver.  You passed GBS during a previous pregnancy.  You have a urinary tract infection caused by GBS any time during pregnancy.  You have a fever during labor. SYMPTOMS Most women who have GBS do not have any symptoms. If you have a urinary tract infection caused by GBS, you might have frequent or painful urination and fever. Babies who get GBS usually show symptoms within 7 days of birth. Symptoms may include:   Breathing problems.  Heart and blood pressure problems.  Digestive and kidney problems. DIAGNOSIS Routine screening for GBS is recommended for all pregnant women. A health care provider takes a sample of the fluid in your vagina and rectum with a swab. It is then sent to a lab to be checked for GBS. A sample of your urine may also be checked for the bacteria.  TREATMENT If you test positive for GBS, you may need treatment with an antibiotic medicine during labor. As soon as you go into labor, or as soon as  your membranes rupture, you will get the antibiotic medicine through an IV access. You will continue to get the medicine until after you give birth. You do not need antibiotic medicine if you are having a cesarean delivery.If your baby shows signs or symptoms of GBS after birth, your baby can also be treated with an antibiotic medicine. HOME CARE INSTRUCTIONS   Take all antibiotic medicine as prescribed by your health care provider. Only take medicine as directed.   Continue with prenatal visits and care.   Keep all follow-up appointments.  SEEK MEDICAL CARE IF:   You have pain when you urinate.   You have to urinate frequently.   You have a fever.  SEEK IMMEDIATE MEDICAL CARE IF:   Your membranes rupture.  You go into labor.   This information is not intended to replace advice given to you by your health care provider. Make sure you discuss any questions you have with your health care provider.   Document Released: 04/26/2007 Document Revised: 01/22/2013 Document Reviewed: 11/09/2012 Elsevier Interactive Patient Education 2016 [employer]  

## 2015-08-24 ENCOUNTER — Ambulatory Visit (INDEPENDENT_AMBULATORY_CARE_PROVIDER_SITE_OTHER): Payer: Medicaid Other | Admitting: Advanced Practice Midwife

## 2015-08-24 DIAGNOSIS — Z3483 Encounter for supervision of other normal pregnancy, third trimester: Secondary | ICD-10-CM

## 2015-08-24 DIAGNOSIS — O09293 Supervision of pregnancy with other poor reproductive or obstetric history, third trimester: Secondary | ICD-10-CM

## 2015-08-24 NOTE — Patient Instructions (Signed)

## 2015-08-24 NOTE — Progress Notes (Signed)
Subjective:  Jennifer Ryan is a 30 y.o. G2P1001 at [redacted]w[redacted]d being seen today for ongoing prenatal care.  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  Patient reports no complaints.  Contractions: Irregular. Vag. Bleeding: None.  Movement: Present. Denies leaking of fluid.   The following portions of the patient's history were reviewed and updated as appropriate: allergies, current medications, past family history, past medical history, past social history, past surgical history and problem list. Problem list updated.  Objective:   Vitals:   08/24/15 0846  BP: 115/66  Pulse: 69  Weight: 138 lb (62.6 kg)    Fetal Status: Fetal Heart Rate (bpm): 137   Movement: Present     General:  Alert, oriented and cooperative. Patient is in no acute distress.  Skin: Skin is warm and dry. No rash noted.   Cardiovascular: Normal heart rate noted  Respiratory: Normal respiratory effort, no problems with respiration noted  Abdomen: Soft, gravid, appropriate for gestational age. Pain/Pressure: Present     Pelvic:  Cervical exam performed        Extremities: Normal range of motion.  Edema: None  Mental Status: Normal mood and affect. Normal behavior. Normal judgment and thought content.   Urinalysis: Urine Protein: Negative Urine Glucose: Negative  Assessment and Plan:  Pregnancy: G2P1001 at [redacted]w[redacted]d  1. Encounter for supervision of other normal pregnancy in third trimester --Hx of fast labor, reviewed s/sx of labor, when to come to hospital.  2. Hx maternal GBS (group B streptococcus) affected neonate, pregnant, third trimester --Treat with PCN in labor.  Term labor symptoms and general obstetric precautions including but not  limited to vaginal bleeding, contractions, leaking of fluid and fetal movement were reviewed in detail with the patient. Please refer to After Visit Summary for other counseling recommendations.  Return in 1 week (on 08/31/2015).   Hurshel Party, CNM

## 2015-08-25 ENCOUNTER — Inpatient Hospital Stay (HOSPITAL_COMMUNITY)
Admission: AD | Admit: 2015-08-25 | Discharge: 2015-08-26 | Disposition: A | Discharge status: Home / Self Care | Payer: Medicaid Other | Source: Ambulatory Visit | Attending: Obstetrics & Gynecology | Admitting: Obstetrics & Gynecology

## 2015-08-25 ENCOUNTER — Encounter (HOSPITAL_COMMUNITY): Payer: Self-pay | Admitting: *Deleted

## 2015-08-25 DIAGNOSIS — Z3A39 39 weeks gestation of pregnancy: Secondary | ICD-10-CM | POA: Insufficient documentation

## 2015-08-25 DIAGNOSIS — Z3493 Encounter for supervision of normal pregnancy, unspecified, third trimester: Secondary | ICD-10-CM | POA: Insufficient documentation

## 2015-08-25 NOTE — MAU Note (Signed)
Patient presents at [redacted] weeks gestation with c/o contractions every 2 minutes X 1-2 hours. Fetus active. Denies bleeding but states she has a yellow discharge.

## 2015-08-26 DIAGNOSIS — Z3493 Encounter for supervision of normal pregnancy, unspecified, third trimester: Secondary | ICD-10-CM | POA: Diagnosis present

## 2015-08-26 DIAGNOSIS — Z3A39 39 weeks gestation of pregnancy: Secondary | ICD-10-CM | POA: Diagnosis present

## 2015-08-26 NOTE — Discharge Instructions (Signed)
Fetal Movement Counts °Patient Name: __________________________________________________ Patient Due Date: ____________________ °Performing a fetal movement count is highly recommended in high-risk pregnancies, but it is good for every pregnant woman to do. Your health care provider may ask you to start counting fetal movements at 28 weeks of the pregnancy. Fetal movements often increase: °· After eating a full meal. °· After physical activity. °· After eating or drinking something sweet or cold. °· At rest. °Pay attention to when you feel the baby is most active. This will help you notice a pattern of your baby's sleep and wake cycles and what factors contribute to an increase in fetal movement. It is important to perform a fetal movement count at the same time each day when your baby is normally most active.  °HOW TO COUNT FETAL MOVEMENTS °1. Find a quiet and comfortable area to sit or lie down on your left side. Lying on your left side provides the best blood and oxygen circulation to your baby. °2. Write down the day and time on a sheet of paper or in a journal. °3. Start counting kicks, flutters, swishes, rolls, or jabs in a 2-hour period. You should feel at least 10 movements within 2 hours. °4. If you do not feel 10 movements in 2 hours, wait 2-3 hours and count again. Look for a change in the pattern or not enough counts in 2 hours. °SEEK MEDICAL CARE IF: °· You feel less than 10 counts in 2 hours, tried twice. °· There is no movement in over an hour. °· The pattern is changing or taking longer each day to reach 10 counts in 2 hours. °· You feel the baby is not moving as he or she usually does. °Date: ____________ Movements: ____________ Start time: ____________ Finish time: ____________  °Date: ____________ Movements: ____________ Start time: ____________ Finish time: ____________ °Date: ____________ Movements: ____________ Start time: ____________ Finish time: ____________ °Date: ____________ Movements:  ____________ Start time: ____________ Finish time: ____________ °Date: ____________ Movements: ____________ Start time: ____________ Finish time: ____________ °Date: ____________ Movements: ____________ Start time: ____________ Finish time: ____________ °Date: ____________ Movements: ____________ Start time: ____________ Finish time: ____________ °Date: ____________ Movements: ____________ Start time: ____________ Finish time: ____________  °Date: ____________ Movements: ____________ Start time: ____________ Finish time: ____________ °Date: ____________ Movements: ____________ Start time: ____________ Finish time: ____________ °Date: ____________ Movements: ____________ Start time: ____________ Finish time: ____________ °Date: ____________ Movements: ____________ Start time: ____________ Finish time: ____________ °Date: ____________ Movements: ____________ Start time: ____________ Finish time: ____________ °Date: ____________ Movements: ____________ Start time: ____________ Finish time: ____________ °Date: ____________ Movements: ____________ Start time: ____________ Finish time: ____________  °Date: ____________ Movements: ____________ Start time: ____________ Finish time: ____________ °Date: ____________ Movements: ____________ Start time: ____________ Finish time: ____________ °Date: ____________ Movements: ____________ Start time: ____________ Finish time: ____________ °Date: ____________ Movements: ____________ Start time: ____________ Finish time: ____________ °Date: ____________ Movements: ____________ Start time: ____________ Finish time: ____________ °Date: ____________ Movements: ____________ Start time: ____________ Finish time: ____________ °Date: ____________ Movements: ____________ Start time: ____________ Finish time: ____________  °Date: ____________ Movements: ____________ Start time: ____________ Finish time: ____________ °Date: ____________ Movements: ____________ Start time: ____________ Finish  time: ____________ °Date: ____________ Movements: ____________ Start time: ____________ Finish time: ____________ °Date: ____________ Movements: ____________ Start time: ____________ Finish time: ____________ °Date: ____________ Movements: ____________ Start time: ____________ Finish time: ____________ °Date: ____________ Movements: ____________ Start time: ____________ Finish time: ____________ °Date: ____________ Movements: ____________ Start time: ____________ Finish time: ____________  °Date: ____________ Movements: ____________ Start time: ____________ Finish   time: ____________ °Date: ____________ Movements: ____________ Start time: ____________ Finish time: ____________ °Date: ____________ Movements: ____________ Start time: ____________ Finish time: ____________ °Date: ____________ Movements: ____________ Start time: ____________ Finish time: ____________ °Date: ____________ Movements: ____________ Start time: ____________ Finish time: ____________ °Date: ____________ Movements: ____________ Start time: ____________ Finish time: ____________ °Date: ____________ Movements: ____________ Start time: ____________ Finish time: ____________  °Date: ____________ Movements: ____________ Start time: ____________ Finish time: ____________ °Date: ____________ Movements: ____________ Start time: ____________ Finish time: ____________ °Date: ____________ Movements: ____________ Start time: ____________ Finish time: ____________ °Date: ____________ Movements: ____________ Start time: ____________ Finish time: ____________ °Date: ____________ Movements: ____________ Start time: ____________ Finish time: ____________ °Date: ____________ Movements: ____________ Start time: ____________ Finish time: ____________ °Date: ____________ Movements: ____________ Start time: ____________ Finish time: ____________  °Date: ____________ Movements: ____________ Start time: ____________ Finish time: ____________ °Date: ____________  Movements: ____________ Start time: ____________ Finish time: ____________ °Date: ____________ Movements: ____________ Start time: ____________ Finish time: ____________ °Date: ____________ Movements: ____________ Start time: ____________ Finish time: ____________ °Date: ____________ Movements: ____________ Start time: ____________ Finish time: ____________ °Date: ____________ Movements: ____________ Start time: ____________ Finish time: ____________ °Date: ____________ Movements: ____________ Start time: ____________ Finish time: ____________  °Date: ____________ Movements: ____________ Start time: ____________ Finish time: ____________ °Date: ____________ Movements: ____________ Start time: ____________ Finish time: ____________ °Date: ____________ Movements: ____________ Start time: ____________ Finish time: ____________ °Date: ____________ Movements: ____________ Start time: ____________ Finish time: ____________ °Date: ____________ Movements: ____________ Start time: ____________ Finish time: ____________ °Date: ____________ Movements: ____________ Start time: ____________ Finish time: ____________ °  °This information is not intended to replace advice given to you by your health care provider. Make sure you discuss any questions you have with your health care provider. °  °Document Released: 02/16/2006 Document Revised: 02/07/2014 Document Reviewed: 11/14/2011 °Elsevier Interactive Patient Education ©2016 [employer] °Braxton Hicks Contractions °Contractions of the uterus can occur throughout pregnancy. Contractions are not always a sign that you are in labor.  °WHAT ARE BRAXTON HICKS CONTRACTIONS?  °Contractions that occur before labor are called Braxton Hicks contractions, or false labor. Toward the end of pregnancy (32-34 weeks), these contractions can develop more often and may become more forceful. This is not true labor because these contractions do not result in opening (dilatation) and thinning of  the cervix. They are sometimes difficult to tell apart from true labor because these contractions can be forceful and people have different pain tolerances. You should not feel embarrassed if you go to the hospital with false labor. Sometimes, the only way to tell if you are in true labor is for your health care provider to look for changes in the cervix. °If there are no prenatal problems or other health problems associated with the pregnancy, it is completely safe to be sent home with false labor and await the onset of true labor. °HOW CAN YOU TELL THE DIFFERENCE BETWEEN TRUE AND FALSE LABOR? °False Labor °· The contractions of false labor are usually shorter and not as hard as those of true labor.   °· The contractions are usually irregular.   °· The contractions are often felt in the front of the lower abdomen and in the groin.   °· The contractions may go away when you walk around or change positions while lying down.   °· The contractions get weaker and are shorter lasting as time goes on.   °· The contractions do not usually become progressively stronger, regular, and closer together as with true labor.   °True Labor °· Contractions in true   labor last 30-70 seconds, become very regular, usually become more intense, and increase in frequency.   °· The contractions do not go away with walking.   °· The discomfort is usually felt in the top of the uterus and spreads to the lower abdomen and low back.   °· True labor can be determined by your health care provider with an exam. This will show that the cervix is dilating and getting thinner.   °WHAT TO REMEMBER °· Keep up with your usual exercises and follow other instructions given by your health care provider.   °· Take medicines as directed by your health care provider.   °· Keep your regular prenatal appointments.   °· Eat and drink lightly if you think you are going into labor.   °· If Braxton Hicks contractions are making you uncomfortable:   °¨ Change your  position from lying down or resting to walking, or from walking to resting.   °¨ Sit and rest in a tub of warm water.   °¨ Drink 2-3 glasses of water. Dehydration may cause these contractions.   °¨ Do slow and deep breathing several times an hour.   °WHEN SHOULD I SEEK IMMEDIATE MEDICAL CARE? °Seek immediate medical care if: °· Your contractions become stronger, more regular, and closer together.   °· You have fluid leaking or gushing from your vagina.   °· You have a fever.   °· You pass blood-tinged mucus.   °· You have vaginal bleeding.   °· You have continuous abdominal pain.   °· You have low back pain that you never had before.   °· You feel your baby's head pushing down and causing pelvic pressure.   °· Your baby is not moving as much as it used to.   °  °This information is not intended to replace advice given to you by your health care provider. Make sure you discuss any questions you have with your health care provider. °  °Document Released: 01/17/2005 Document Revised: 01/22/2013 Document Reviewed: 10/29/2012 °Elsevier Interactive Patient Education ©2016 [employer] ° °

## 2015-08-31 ENCOUNTER — Telehealth (HOSPITAL_COMMUNITY): Payer: Self-pay | Admitting: *Deleted

## 2015-08-31 ENCOUNTER — Encounter: Payer: Self-pay | Admitting: Advanced Practice Midwife

## 2015-08-31 ENCOUNTER — Encounter: Payer: Self-pay | Admitting: *Deleted

## 2015-08-31 ENCOUNTER — Encounter (HOSPITAL_COMMUNITY): Payer: Self-pay | Admitting: *Deleted

## 2015-08-31 ENCOUNTER — Ambulatory Visit (INDEPENDENT_AMBULATORY_CARE_PROVIDER_SITE_OTHER): Payer: Medicaid Other | Admitting: Advanced Practice Midwife

## 2015-08-31 DIAGNOSIS — O98819 Other maternal infectious and parasitic diseases complicating pregnancy, unspecified trimester: Secondary | ICD-10-CM

## 2015-08-31 DIAGNOSIS — Z3483 Encounter for supervision of other normal pregnancy, third trimester: Secondary | ICD-10-CM

## 2015-08-31 DIAGNOSIS — Z3493 Encounter for supervision of normal pregnancy, unspecified, third trimester: Secondary | ICD-10-CM

## 2015-08-31 DIAGNOSIS — O99820 Streptococcus B carrier state complicating pregnancy: Secondary | ICD-10-CM

## 2015-08-31 DIAGNOSIS — B951 Streptococcus, group B, as the cause of diseases classified elsewhere: Secondary | ICD-10-CM

## 2015-08-31 DIAGNOSIS — O480 Post-term pregnancy: Secondary | ICD-10-CM

## 2015-08-31 NOTE — Progress Notes (Signed)
Subjective:  Jennifer Ryan is a 30 y.o. G2P1001 at [redacted]w[redacted]d being seen today for ongoing prenatal care.  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  Patient reports no complaints.  Contractions: Irregular. Vag. Bleeding: None, Scant.  Movement: Present. Denies leaking of fluid.   The following portions of the patient's history were reviewed and updated as appropriate: allergies, current medications, past family history, past medical history, past social history, past surgical history and problem list. Problem list updated.  Objective:   Vitals:   08/31/15 0836  BP: 127/76  Pulse: 86  Weight: 138 lb (62.6 kg)    Fetal Status:     Movement: Present     General:  Alert, oriented and cooperative. Patient is in no acute distress.  Skin: Skin is warm and dry. No rash noted.   Cardiovascular: Normal heart rate noted  Respiratory: Normal respiratory effort, no problems with respiration noted  Abdomen: Soft, gravid, appropriate for gestational age. Pain/Pressure: Present     Pelvic:  Cervical exam deferred        Extremities: Normal range of motion.  Edema: None  Mental Status: Normal mood and affect. Normal behavior. Normal judgment and thought content.   Urinalysis: Urine Protein: Negative Urine Glucose: Negative NST reactive Irregular UCs  Assessment and Plan:  Pregnancy: G2P1001 at [redacted]w[redacted]d  There are no diagnoses linked to this encounter. Term labor symptoms and general obstetric precautions including but not limited to vaginal bleeding, contractions, leaking of fluid and fetal movement were reviewed in detail with the patient. Please refer to After Visit Summary for other counseling recommendations.   Discussed  signs of labor and possible need for IOL if goes postdates Will schedule for 41 weeks Since cervix is favorable, may only need Pitocin Needs Clinda as soon as she arrives.  Didn't have time last labor, has rapid labors.   Aviva Signs, CNM

## 2015-08-31 NOTE — Telephone Encounter (Signed)
Preadmission screen  

## 2015-08-31 NOTE — Progress Notes (Signed)
Patient ID: Jennifer Ryan, female   DOB: 1985/02/11, 30 y.o.   MRN: [id number]

## 2015-08-31 NOTE — Patient Instructions (Signed)

## 2015-09-02 ENCOUNTER — Inpatient Hospital Stay (HOSPITAL_COMMUNITY): Payer: Medicaid Other | Admitting: Anesthesiology

## 2015-09-02 ENCOUNTER — Encounter (HOSPITAL_COMMUNITY): Payer: Self-pay | Admitting: *Deleted

## 2015-09-02 ENCOUNTER — Inpatient Hospital Stay (HOSPITAL_COMMUNITY)
Admission: AD | Admit: 2015-09-02 | Discharge: 2015-09-04 | DRG: 775 | Disposition: A | Discharge status: Home / Self Care | Payer: Medicaid Other | Source: Ambulatory Visit | Attending: Obstetrics & Gynecology | Admitting: Obstetrics & Gynecology

## 2015-09-02 DIAGNOSIS — O99824 Streptococcus B carrier state complicating childbirth: Secondary | ICD-10-CM | POA: Diagnosis present

## 2015-09-02 DIAGNOSIS — Z141 Cystic fibrosis carrier: Secondary | ICD-10-CM | POA: Diagnosis not present

## 2015-09-02 DIAGNOSIS — Z882 Allergy status to sulfonamides status: Secondary | ICD-10-CM

## 2015-09-02 DIAGNOSIS — Z3A40 40 weeks gestation of pregnancy: Secondary | ICD-10-CM

## 2015-09-02 DIAGNOSIS — Z888 Allergy status to other drugs, medicaments and biological substances status: Secondary | ICD-10-CM

## 2015-09-02 DIAGNOSIS — Z3480 Encounter for supervision of other normal pregnancy, unspecified trimester: Secondary | ICD-10-CM

## 2015-09-02 DIAGNOSIS — Z3483 Encounter for supervision of other normal pregnancy, third trimester: Secondary | ICD-10-CM

## 2015-09-02 DIAGNOSIS — Z880 Allergy status to penicillin: Secondary | ICD-10-CM

## 2015-09-02 DIAGNOSIS — O99820 Streptococcus B carrier state complicating pregnancy: Secondary | ICD-10-CM

## 2015-09-02 DIAGNOSIS — O09293 Supervision of pregnancy with other poor reproductive or obstetric history, third trimester: Secondary | ICD-10-CM

## 2015-09-02 DIAGNOSIS — O4202 Full-term premature rupture of membranes, onset of labor within 24 hours of rupture: Secondary | ICD-10-CM | POA: Diagnosis not present

## 2015-09-02 LAB — CBC
HEMATOCRIT: 34.9 % — AB (ref 36.0–46.0)
Hemoglobin: 12 g/dL (ref 12.0–15.0)
MCH: 29.2 pg (ref 26.0–34.0)
MCHC: 34.4 g/dL (ref 30.0–36.0)
MCV: 84.9 fL (ref 78.0–100.0)
PLATELETS: 223 10*3/uL (ref 150–400)
RBC: 4.11 MIL/uL (ref 3.87–5.11)
RDW: 14.3 % (ref 11.5–15.5)
WBC: 9.7 10*3/uL (ref 4.0–10.5)

## 2015-09-02 LAB — TYPE AND SCREEN
ABO/RH(D): O POS
Antibody Screen: NEGATIVE

## 2015-09-02 LAB — ABO/RH: ABO/RH(D): O POS

## 2015-09-02 LAB — POCT FERN TEST
POCT FERN TEST: NEGATIVE
POCT FERN TEST: POSITIVE

## 2015-09-02 MED ORDER — PRENATAL MULTIVITAMIN CH
1.0000 | ORAL_TABLET | Freq: Every day | ORAL | Status: DC
  Administered 2015-09-03 – 2015-09-04 (×2): 1 via ORAL
  Filled 2015-09-02 (×2): qty 1

## 2015-09-02 MED ORDER — ALBUTEROL SULFATE (2.5 MG/3ML) 0.083% IN NEBU: 3.0000 mL | INHALATION_SOLUTION | Freq: Four times a day (QID) | RESPIRATORY_TRACT | Status: DC | PRN

## 2015-09-02 MED ORDER — MISOPROSTOL 200 MCG PO TABS
50.0000 ug | ORAL_TABLET | ORAL | Status: DC
  Administered 2015-09-02: 50 ug via ORAL
  Filled 2015-09-02: qty 0.5

## 2015-09-02 MED ORDER — LIDOCAINE HCL (PF) 1 % IJ SOLN
30.0000 mL | INTRAMUSCULAR | Status: DC | PRN
  Filled 2015-09-02: qty 30

## 2015-09-02 MED ORDER — SOD CITRATE-CITRIC ACID 500-334 MG/5ML PO SOLN: 30.0000 mL | ORAL | Status: DC | PRN

## 2015-09-02 MED ORDER — PHENYLEPHRINE 40 MCG/ML (10ML) SYRINGE FOR IV PUSH (FOR BLOOD PRESSURE SUPPORT)
80.0000 ug | PREFILLED_SYRINGE | INTRAVENOUS | Status: DC | PRN
  Filled 2015-09-02: qty 10
  Filled 2015-09-02: qty 5

## 2015-09-02 MED ORDER — ONDANSETRON HCL 4 MG/2ML IJ SOLN: 4.0000 mg | Freq: Four times a day (QID) | INTRAMUSCULAR | Status: DC | PRN

## 2015-09-02 MED ORDER — FENTANYL 2.5 MCG/ML BUPIVACAINE 1/10 % EPIDURAL INFUSION (WH - ANES)
14.0000 mL/h | INTRAMUSCULAR | Status: DC | PRN
  Administered 2015-09-02: 14 mL/h via EPIDURAL
  Filled 2015-09-02: qty 125

## 2015-09-02 MED ORDER — LACTATED RINGERS IV SOLN
500.0000 mL | Freq: Once | INTRAVENOUS | Status: AC
  Administered 2015-09-02: 500 mL via INTRAVENOUS

## 2015-09-02 MED ORDER — BENZOCAINE-MENTHOL 20-0.5 % EX AERO
1.0000 "application " | INHALATION_SPRAY | CUTANEOUS | Status: DC | PRN
  Administered 2015-09-02: 1 via TOPICAL
  Filled 2015-09-02: qty 56

## 2015-09-02 MED ORDER — COCONUT OIL OIL
1.0000 "application " | TOPICAL_OIL | Status: DC | PRN
  Administered 2015-09-03: 1 via TOPICAL
  Filled 2015-09-02: qty 120

## 2015-09-02 MED ORDER — OXYCODONE-ACETAMINOPHEN 5-325 MG PO TABS
1.0000 | ORAL_TABLET | ORAL | Status: DC | PRN
  Administered 2015-09-03 – 2015-09-04 (×4): 1 via ORAL
  Filled 2015-09-02 (×4): qty 1

## 2015-09-02 MED ORDER — TETANUS-DIPHTH-ACELL PERTUSSIS 5-2.5-18.5 LF-MCG/0.5 IM SUSP: 0.5000 mL | Freq: Once | INTRAMUSCULAR | Status: DC

## 2015-09-02 MED ORDER — ZOLPIDEM TARTRATE 5 MG PO TABS: 5.0000 mg | ORAL_TABLET | Freq: Every evening | ORAL | Status: DC | PRN

## 2015-09-02 MED ORDER — LACTATED RINGERS IV SOLN: 500.0000 mL | INTRAVENOUS | Status: DC | PRN

## 2015-09-02 MED ORDER — OXYCODONE-ACETAMINOPHEN 5-325 MG PO TABS: 1.0000 | ORAL_TABLET | ORAL | Status: DC | PRN

## 2015-09-02 MED ORDER — EPHEDRINE 5 MG/ML INJ
10.0000 mg | INTRAVENOUS | Status: DC | PRN
  Filled 2015-09-02: qty 4

## 2015-09-02 MED ORDER — ONDANSETRON HCL 4 MG PO TABS: 4.0000 mg | ORAL_TABLET | ORAL | Status: DC | PRN

## 2015-09-02 MED ORDER — SIMETHICONE 80 MG PO CHEW: 80.0000 mg | CHEWABLE_TABLET | ORAL | Status: DC | PRN

## 2015-09-02 MED ORDER — OXYTOCIN 40 UNITS IN LACTATED RINGERS INFUSION - SIMPLE MED
2.5000 [IU]/h | INTRAVENOUS | Status: DC
  Administered 2015-09-02: 2.5 [IU]/h via INTRAVENOUS
  Filled 2015-09-02: qty 1000

## 2015-09-02 MED ORDER — CLINDAMYCIN PHOSPHATE 900 MG/50ML IV SOLN
900.0000 mg | Freq: Three times a day (TID) | INTRAVENOUS | Status: DC
  Administered 2015-09-02: 900 mg via INTRAVENOUS
  Filled 2015-09-02 (×2): qty 50

## 2015-09-02 MED ORDER — DIBUCAINE 1 % RE OINT: 1.0000 "application " | TOPICAL_OINTMENT | RECTAL | Status: DC | PRN

## 2015-09-02 MED ORDER — MOMETASONE FURO-FORMOTEROL FUM 200-5 MCG/ACT IN AERO: 2.0000 | INHALATION_SPRAY | Freq: Two times a day (BID) | RESPIRATORY_TRACT | Status: DC | PRN

## 2015-09-02 MED ORDER — MISOPROSTOL 25 MCG QUARTER TABLET: 25.0000 ug | ORAL_TABLET | ORAL | Status: DC

## 2015-09-02 MED ORDER — PHENYLEPHRINE 40 MCG/ML (10ML) SYRINGE FOR IV PUSH (FOR BLOOD PRESSURE SUPPORT)
80.0000 ug | PREFILLED_SYRINGE | INTRAVENOUS | Status: DC | PRN
  Filled 2015-09-02: qty 5

## 2015-09-02 MED ORDER — OXYTOCIN BOLUS FROM INFUSION
500.0000 mL | Freq: Once | INTRAVENOUS | Status: AC
  Administered 2015-09-02: 500 mL via INTRAVENOUS

## 2015-09-02 MED ORDER — DIPHENHYDRAMINE HCL 50 MG/ML IJ SOLN: 12.5000 mg | INTRAMUSCULAR | Status: DC | PRN

## 2015-09-02 MED ORDER — DIPHENHYDRAMINE HCL 25 MG PO CAPS: 25.0000 mg | ORAL_CAPSULE | Freq: Four times a day (QID) | ORAL | Status: DC | PRN

## 2015-09-02 MED ORDER — LACTATED RINGERS IV SOLN
INTRAVENOUS | Status: DC
Start: 2015-09-02 — End: 2015-09-02
  Administered 2015-09-02: 12:00:00 via INTRAVENOUS

## 2015-09-02 MED ORDER — SENNOSIDES-DOCUSATE SODIUM 8.6-50 MG PO TABS
2.0000 | ORAL_TABLET | ORAL | Status: DC
  Administered 2015-09-02 – 2015-09-04 (×2): 2 via ORAL
  Filled 2015-09-02 (×2): qty 2

## 2015-09-02 MED ORDER — ACETAMINOPHEN 325 MG PO TABS
650.0000 mg | ORAL_TABLET | ORAL | Status: DC | PRN
  Administered 2015-09-03: 650 mg via ORAL
  Filled 2015-09-02: qty 2

## 2015-09-02 MED ORDER — WITCH HAZEL-GLYCERIN EX PADS: 1.0000 "application " | MEDICATED_PAD | CUTANEOUS | Status: DC | PRN

## 2015-09-02 MED ORDER — IBUPROFEN 600 MG PO TABS
600.0000 mg | ORAL_TABLET | Freq: Four times a day (QID) | ORAL | Status: DC
  Administered 2015-09-02 – 2015-09-04 (×8): 600 mg via ORAL
  Filled 2015-09-02 (×8): qty 1

## 2015-09-02 MED ORDER — OXYCODONE-ACETAMINOPHEN 5-325 MG PO TABS: 2.0000 | ORAL_TABLET | ORAL | Status: DC | PRN

## 2015-09-02 MED ORDER — LIDOCAINE HCL (PF) 1 % IJ SOLN
INTRAMUSCULAR | Status: DC | PRN
  Administered 2015-09-02: 8 mL via EPIDURAL
  Administered 2015-09-02: 6 mL via EPIDURAL

## 2015-09-02 MED ORDER — ONDANSETRON HCL 4 MG/2ML IJ SOLN: 4.0000 mg | INTRAMUSCULAR | Status: DC | PRN

## 2015-09-02 MED ORDER — ACETAMINOPHEN 325 MG PO TABS: 650.0000 mg | ORAL_TABLET | ORAL | Status: DC | PRN

## 2015-09-02 NOTE — Anesthesia Preprocedure Evaluation (Signed)
Anesthesia Evaluation  Patient identified by MRN, date of birth, ID band Patient awake    Reviewed: Allergy & Precautions, H&P , Patient's Chart, lab work & pertinent test results  Airway Mallampati: I  TM Distance: >3 FB Neck ROM: full    Dental no notable dental hx.    Pulmonary neg pulmonary ROS,    Pulmonary exam normal        Cardiovascular negative cardio ROS Normal cardiovascular exam     Neuro/Psych negative neurological ROS  negative psych ROS   GI/Hepatic negative GI ROS, Neg liver ROS,   Endo/Other  negative endocrine ROS  Renal/GU negative Renal ROS     Musculoskeletal   Abdominal Normal abdominal exam  (+)   Peds  Hematology negative hematology ROS (+)   Anesthesia Other Findings   Reproductive/Obstetrics (+) Pregnancy                             Anesthesia Physical Anesthesia Plan  ASA: II  Anesthesia Plan: Epidural   Post-op Pain Management:    Induction:   Airway Management Planned:   Additional Equipment:   Intra-op Plan:   Post-operative Plan:   Informed Consent: I have reviewed the patients History and Physical, chart, labs and discussed the procedure including the risks, benefits and alternatives for the proposed anesthesia with the patient or authorized representative who has indicated his/her understanding and acceptance.     Plan Discussed with:   Anesthesia Plan Comments:         Anesthesia Quick Evaluation  

## 2015-09-02 NOTE — H&P (Signed)
LABOR AND DELIVERY ADMISSION HISTORY AND PHYSICAL NOTE  Jennifer Ryan is a 30 y.o. female G2P1001 with IUP at [redacted]w[redacted]d by 8 week [place] presenting for SROM@0730  today.   She reports positive fetal movement. She denies leakage of fluid or vaginal bleeding.  Prenatal History/Complications:  Past Medical History: Past Medical History:  Diagnosis Date  . Medical history non-contributory   . No pertinent past medical history     Past Surgical History: Past Surgical History:  Procedure Laterality Date  . WISDOM TOOTH EXTRACTION  2007    Obstetrical History: OB History    Gravida Para Term Preterm AB Living   SAB TAB Ectopic Multiple Live Births           1      Social History: Social History   Social History  . Marital status: Married    Spouse name: [name]  . Number of children: 1   . Years of education: N/A   Occupational History  . 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 Smoking status: Never Smoker  . Smokeless tobacco: Never Used  . Alcohol use No  . Drug use: No  . Sexual activity: Yes    Partners: Male    Birth control/ protection: None   Other Topics Concern  . None   Social History Narrative    2 caffiene per day. No regular exercise routine.     Family History: Family History  Problem Relation Age of Onset  . Prostate cancer Paternal Grandfather   . Hypertension Paternal Grandfather   . Brain cancer      pat great grandfather  . Heart attack Paternal Uncle   . Depression Maternal Aunt   . Diabetes      cousins   . Hyperlipidemia    . Hypertension Paternal Grandmother     Allergies: Allergies  Allergen Reactions  . Bactrim [Sulfamethoxazole-Trimethoprim] Rash  . Penicillins Rash  . Sulfa Antibiotics Rash    Prescriptions Prior to Admission  Medication Sig Dispense Refill Last Dose  . albuterol (PROVENTIL HFA;VENTOLIN HFA) 108 (90 Base) MCG/ACT inhaler Inhale 2 puffs into  the lungs every 6 (six) hours as needed for wheezing or shortness of breath. 1 Inhaler 0 Taking  . fluticasone-salmeterol (ADVAIR HFA) 115-21 MCG/ACT inhaler Inhale 2 puffs into the lungs 2 (two) times daily. 1 Inhaler 0 Taking  . Prenatal Vit-Fe Fumarate-FA (PRENATAL MULTIVITAMIN) TABS Take 1 tablet by mouth at bedtime.   Taking     Review of Systems   All systems reviewed and negative except as stated in HPI  Blood pressure 108/66, pulse 102, temperature 98.5 F (36.9 C), temperature source Oral, resp. rate 18, height 5' (1.524 m), weight 80.7 kg (178 lb), last menstrual period 11/23/2014, unknown if currently breastfeeding. General appearance: alert, cooperative and no distress Lungs: clear to auscultation bilaterally Heart: regular rate and rhythm Abdomen: soft, non-tender; bowel sounds normal Extremities: No calf swelling or tenderness Presentation: cephalic Fetal monitoring: cat 1 Uterine activity: ctx q10 Dilation: 1 Effacement (%): Thick Exam by:: Dr. Chanetta Marshall   Prenatal labs: ABO, Rh: O/POS/-- (12/19 1143) Antibody: NEG (12/19 1143) Rubella: !Error! RPR: NON REAC (05/08 1106)  HBsAg: NEGATIVE (12/19 1143)  HIV: NONREACTIVE (05/08 1106)  GBS: Positive (07/03 0000)  1 hr Glucola: early third tri 113 Genetic screening:  declined Anatomy US: normal female  Prenatal Transfer Tool  Maternal  Diabetes: No Genetic Screening: Declined Maternal Ultrasounds/Referrals: Normal Fetal Ultrasounds or other Referrals:  None Maternal Substance Abuse:  No Significant Maternal Medications:  None Significant Maternal Lab Results: Lab values include: Group B Strep positive  Results for orders placed or performed during the hospital encounter of 09/02/15 (from the past 24 hour(s))  Fern Test   Collection Time: 09/02/15 10:35 AM  Result Value Ref Range   POCT Fern Test Negative = intact amniotic membranes   Fern Test   Collection Time: 09/02/15 11:13 AM  Result Value Ref Range    POCT Fern Test Positive = ruptured amniotic membanes     Patient Active Problem List   Diagnosis Date Noted  . Group B Streptococcus carrier, +RV culture, currently pregnant 08/07/2015  . Hx maternal GBS (group B streptococcus) affected neonate, pregnant 01/24/2015  . Supervision of normal subsequent pregnancy 01/19/2015  . Cystic fibrosis gene carrier 07/17/2011    Assessment: Jennifer Ryan is a 30 y.o. G2P1001 at [redacted]w[redacted]d here for SROM@0730  today.  #Labor: cytotec #Pain: Desires epidural #FWB: Cat 1 #ID:  GBS pos, PCN allergic, clinda sens > clinda. Hx of baby with GBS sepsis after inadequate treament #MOF: breast #MOC:OCPs #Circ:  outpatient  Loni Muse 09/02/2015, 11:14 AM   CNM attestation:  I have seen and examined this patient; I agree with above documentation in the resident's note.   Jennifer Ryan is a 30 y.o. G2P1001 here for SROM & early labor  PE: BP 100/64   Pulse 89   Temp 97.6 F (36.4 C) (Oral)   Resp 18   Ht 5' (1.524 m)   Wt 62.6 kg (138 lb)   LMP 11/23/2014   SpO2 100%   BMI 26.95 kg/m  Gen: calm comfortable, NAD Resp: normal effort, no distress Abd: gravid  ROS, labs, PMH reviewed  Plan: Admit to [facility] Expectant management Clinda for GBS ppx May give cytotec for ripening prn  Cam Hai CNM 09/02/2015, 4:05 PM

## 2015-09-02 NOTE — Anesthesia Procedure Notes (Signed)
Epidural Patient location during procedure: OB Start time: 09/02/2015 1:42 PM End time: 09/02/2015 1:46 PM  Staffing Anesthesiologist: Leilani Able Performed: anesthesiologist   Preanesthetic Checklist Completed: patient identified, surgical consent, pre-op evaluation, timeout performed, IV checked, risks and benefits discussed and monitors and equipment checked  Epidural Patient position: sitting Prep: site prepped and draped and DuraPrep Patient monitoring: continuous pulse ox and blood pressure Approach: midline Location: L3-L4 Injection technique: LOR air  Needle:  Needle type: Tuohy  Needle gauge: 17 G Needle length: 9 cm and 9 Needle insertion depth: 6 cm Catheter type: closed end flexible Catheter size: 19 Gauge Catheter at skin depth: 11 cm Test dose: negative and Other  Assessment Sensory level: T9 Events: blood not aspirated, injection not painful, no injection resistance, negative IV test and no paresthesia  Additional Notes Reason for block:procedure for pain

## 2015-09-02 NOTE — MAU Note (Addendum)
Back to use restroom, then to MAU rm 3. C/o leaking fluid

## 2015-09-02 NOTE — MAU Note (Signed)
Urine in lab 

## 2015-09-02 NOTE — Anesthesia Postprocedure Evaluation (Signed)
Anesthesia Post Note  Patient: Jennifer Ryan  Procedure(s) Performed: * No procedures listed *  Patient location during evaluation: Mother Baby Anesthesia Type: Epidural Level of consciousness: awake and alert Pain management: pain level controlled Vital Signs Assessment: post-procedure vital signs reviewed and stable Respiratory status: spontaneous breathing, nonlabored ventilation and respiratory function stable Cardiovascular status: stable Postop Assessment: no headache, no backache and epidural receding Anesthetic complications: no     Last Vitals:  Vitals:   09/02/15 1704 09/02/15 1836  BP:  (!) 112/54  Pulse:  74  Resp: 18 20  Temp:  37.1 C    Last Pain:  Vitals:   09/02/15 1836  TempSrc: Oral  PainSc:    Pain Goal: Patients Stated Pain Goal: 2 (09/02/15 1400)               Junious Silk

## 2015-09-03 LAB — RPR: RPR: NONREACTIVE

## 2015-09-03 NOTE — Progress Notes (Signed)
Post Partum Day 1  Subjective:  Jennifer Ryan is a 30 y.o. [id number] [redacted]w[redacted]d s/p VAVD.  No acute events overnight.  Pt denies problems with ambulating, voiding or po intake.  She denies nausea or vomiting.  Pain is well controlled.  She has not had flatus. She has not had bowel movement.  Lochia large, bright red blood, passed clots through the night. Starting to lighten up.  Plan for birth control is oral progesterone-only contraceptive.  Method of Feeding: Breast   Objective: BP (!) 95/45 (BP Location: Left Arm)   Pulse 60   Temp 97.9 F (36.6 C) (Oral)   Resp 18   Ht 5' (1.524 m)   Wt 62.6 kg (138 lb)   LMP 11/23/2014   SpO2 98%   Breastfeeding? Unknown   BMI 26.95 kg/m   Physical Exam:  General: alert, cooperative and no distress Lochia:normal flow Chest: CTAB Heart: RRR no m/r/g Abdomen: +BS, soft, nontender, fundus firm at/below umbilicus Uterine Fundus: firm DVT Evaluation: No evidence of DVT seen on physical exam. Extremities: no edema   Recent Labs  09/02/15 1128  HGB 12.0  HCT 34.9*    Assessment/Plan:  ASSESSMENT: Jennifer Ryan is a 30 y.o. G2P2002 [redacted]w[redacted]d ppd #1 s/p VAVD doing well.   Plan for discharge tomorrow   LOS: 1 day   Jaci Lazier 09/03/2015, 7:42 AM   CNM attestation Post Partum Day #1   Jennifer Ryan is a 30 y.o. [id number] s/p SVD.  Pt denies problems with ambulating, voiding or po intake. Pain is well controlled.  Plan for birth control is oral progesterone-only contraceptive.  Method of Feeding: breast  PE:  BP 111/68   Pulse (!) 56   Temp 97.7 F (36.5 C)   Resp 18   Ht 5' (1.524 m)   Wt 62.6 kg (138 lb)   LMP 11/23/2014   SpO2 98%   Breastfeeding? Unknown   BMI 26.95 kg/m  Fundus firm  Plan for discharge: 09/04/15  Cam Hai, CNM 8:57 AM 09/03/15

## 2015-09-03 NOTE — Lactation Note (Signed)
This note was copied from a baby's chart. Lactation Consultation Note Experienced BF mom reports baby just finished feeding for 20 min. Reports he is latching well with no pain. Asleep on mom's chest at present. LS by RN 9.  Reviewed feeding cues and encouraged to feed whenever she sees them. Discussed cluster feeding and encouraged to rest whenever baby is sleeping. BF brochure given. Reviewed OP appointments and BFSG as resources for support after DC. No questions at present. To call prn   Patient Name: Jennifer Ryan NOMVE'H Date: 09/03/2015 Reason for consult: Initial assessment   Maternal Data Formula Feeding for Exclusion: No Has patient been taught Hand Expression?: Yes Does the patient have breastfeeding experience prior to this delivery?: Yes  Feeding    LATCH Score/Interventions                      Lactation Tools Discussed/Used     Consult Status Consult Status: PRN    Pamelia Hoit 09/03/2015, 9:37 AM

## 2015-09-04 ENCOUNTER — Encounter (HOSPITAL_COMMUNITY): Payer: Self-pay | Admitting: Advanced Practice Midwife

## 2015-09-04 MED ORDER — NORETHINDRONE 0.35 MG PO TABS: 1.0000 | ORAL_TABLET | Freq: Every day | ORAL | 11 refills | Status: DC

## 2015-09-04 MED ORDER — IBUPROFEN 600 MG PO TABS: 600.0000 mg | ORAL_TABLET | Freq: Four times a day (QID) | ORAL | 0 refills | Status: DC | PRN

## 2015-09-04 NOTE — Discharge Summary (Signed)
OB Discharge Summary     Patient Name: LY SLAYDEN DOB: December 03, 1985 MRN: [id number]  Date of admission: 09/02/2015 Delivering MD: [name] H   Date of discharge: 09/04/2015  Admitting diagnosis: 40WKS,WATER BROKE Intrauterine pregnancy: [redacted]w[redacted]d     Secondary diagnosis:  Active Problems:   Supervision of normal pregnancy in third trimester  Additional problems: none     Discharge diagnosis: Term Pregnancy Delivered                                                                                                Post partum procedures:none  Augmentation: Cytotec  Complications: None  Hospital course:  Onset/PROM of Labor With Vaginal Delivery     30 y.o. yo [id number] at [redacted]w[redacted]d was admitted in Latent Labor/PROM on 09/02/2015. She received one dose of Vancomycin prior to delivery. Patient had an uncomplicated labor course as follows:  Membrane Rupture Time/Date: 7:30 AM ,09/02/2015   Intrapartum Procedures: Episiotomy:                                           Lacerations:  1st degree [2];Perineal [11]  Patient had a delivery of a Viable infant. 09/02/2015  Vacuum Assisted Vaginal Delivery due to FHR decels   Pateint had an uncomplicated postpartum course.  She is ambulating, tolerating a regular diet, passing flatus, and urinating well. Patient is discharged home in stable condition on 09/04/15.    Physical exam Vitals:   09/03/15 0505 09/03/15 1819 09/04/15 0100 09/04/15 0610  BP: (!) 95/45 110/69 (!) 97/57 111/68  Pulse: 60 66 68 (!) 56  Resp: 18 18 18 18   Temp: 97.9 F (36.6 C) 98 F (36.7 C) 98.6 F (37 C) 97.7 F (36.5 C)  TempSrc: Oral Oral Oral   SpO2:      Weight:      Height:       General: alert and cooperative Lochia: appropriate Uterine Fundus: firm Incision: N/A DVT Evaluation: No evidence of DVT seen on physical exam. Labs: Lab Results  Component Value Date   WBC 9.7 09/02/2015   HGB 12.0 09/02/2015   HCT 34.9 (L) 09/02/2015   MCV 84.9 09/02/2015    PLT 223 09/02/2015   CMP Latest Ref Rng & Units 02/03/2012  Glucose 70 - 99 mg/dL 80  BUN 6 - 23 mg/dL 8  Creatinine 5.05 - 3.97 mg/dL 6.73  Sodium 419 - 379 mEq/L 140  Potassium 3.5 - 5.3 mEq/L 4.4  Chloride 96 - 112 mEq/L 105  CO2 19 - 32 mEq/L 22  Calcium 8.4 - 10.5 mg/dL 9.1  Total Protein 6.0 - 8.3 g/dL 0.2(I)  Total Bilirubin 0.3 - 1.2 mg/dL 0.3  Alkaline Phos 39 - 117 U/L 164(H)  AST 0 - 37 U/L 19  ALT 0 - 35 U/L 13    Discharge instruction: per After Visit Summary and "Baby and Me Booklet".  After visit meds:    Medication List    STOP taking these medications  ferrous sulfate 325 (65 FE) MG tablet     TAKE these medications   albuterol 108 (90 Base) MCG/ACT inhaler Commonly known as:  PROVENTIL HFA;VENTOLIN HFA Inhale 2 puffs into the lungs every 6 (six) hours as needed for wheezing or shortness of breath.   fluticasone-salmeterol 115-21 MCG/ACT inhaler Commonly known as:  ADVAIR HFA Inhale 2 puffs into the lungs 2 (two) times daily.   ibuprofen 600 MG tablet Commonly known as:  ADVIL,MOTRIN Take 1 tablet (600 mg total) by mouth every 6 (six) hours as needed.   norethindrone 0.35 MG tablet Commonly known as:  ORTHO MICRONOR Take 1 tablet (0.35 mg total) by mouth daily. To start on 09/27/15 Start taking on:  09/27/2015   prenatal multivitamin Tabs tablet Take 1 tablet by mouth at bedtime.       Diet: routine diet  Activity: Advance as tolerated. Pelvic rest for 6 weeks.   Outpatient follow up:6 weeks Follow up Appt:No future appointments. Follow up Visit:No Follow-up on file.  Postpartum contraception: Progesterone only pills  Newborn Data: Live born female  Birth Weight: 6 lb 7.5 oz (2934 g) APGAR: 8, 9  Baby Feeding: Breast Disposition:home with mother   09/04/2015 Cam Hai, CNM  7:53 AM

## 2015-09-04 NOTE — Lactation Note (Signed)
This note was copied from a baby's chart. Lactation Consultation Note Mother states that infant is feeding well. Mother denies having any concerns. She states that she feels her milk coming in. Advised treatment to prevent engorgement. Mother advised to continue to cue base feed and feed at least every 2-3 hours. Do frequent skin to skin. Mother receptive to all teaching. Informed mother of available LC services BFSG'S and community support.   Patient Name: Jennifer Ryan ENIDP'O Date: 09/04/2015 Reason for consult: Follow-up assessment   Maternal Data    Feeding Feeding Type: Breast Fed Length of feed: 10 min  LATCH Score/Interventions                      Lactation Tools Discussed/Used     Consult Status Consult Status: Complete    Michel Bickers 09/04/2015, 10:06 AM

## 2015-09-04 NOTE — Discharge Instructions (Signed)

## 2015-09-06 ENCOUNTER — Inpatient Hospital Stay (HOSPITAL_COMMUNITY): Admission: RE | Admit: 2015-09-06 | Payer: Medicaid Other | Source: Ambulatory Visit

## 2015-10-19 ENCOUNTER — Ambulatory Visit (INDEPENDENT_AMBULATORY_CARE_PROVIDER_SITE_OTHER): Payer: Medicaid Other | Admitting: Certified Nurse Midwife

## 2015-10-19 ENCOUNTER — Encounter: Payer: Self-pay | Admitting: Certified Nurse Midwife

## 2015-10-19 NOTE — Progress Notes (Signed)
Post Partum Exam  Jennifer Ryan is a 30 y.o. 382P2002 female who presents for a postpartum visit. She is 6 weeks postpartum following a spontaneous vaginal delivery. I have fully reviewed the prenatal and intrapartum course. The delivery was at 889w3d gestational weeks.  Anesthesia: epidural. Postpartum course has been unremarkable. Baby's course has been unremarkable. Baby is feeding by breast. Bleeding red, light, and intermittent since delivery. Bowel function is normal. Bladder function is normal. Patient is not sexually active. Contraception method is oral progesterone-only contraceptive. Postpartum depression screening:neg  The following portions of the patient's history were reviewed and updated as appropriate: allergies, current medications, past family history, past medical history, past social history, past surgical history and problem list.  Review of Systems Pertinent items are noted in HPI.   Objective:    BP 116/78 mmHg  Pulse 78  Resp 16  Ht 5\' 5"  (1.651 m)  Wt 211 lb (95.709 kg)  BMI 35.11 kg/m2  Breastfeeding? Yes  General:  alert, cooperative and no distress   Breasts:  negative  Lungs:   Heart:    Abdomen:    Vulva:  normal, approximated, well healed  Vagina: Not evaluated  Cervix:  Not evaluated  Corpus: Not evaluated  Adnexa:  not evaluated  Rectal Exam: Not performed.        Assessment:   Normal postpartum exam. Pap smear not done at today's visit.   Plan:    1. Contraception: oral progesterone-only contraceptive  -discussed importance of taking daily within 2 hrs of scheduled time -some breakthrough bleeding may occur with progestin only methods, if continues after 3 packs, consider switch to COC  2. Follow up in: 1 year or as needed.

## 2015-10-30 ENCOUNTER — Ambulatory Visit: Payer: Medicaid Other | Admitting: Family

## 2015-12-07 ENCOUNTER — Ambulatory Visit: Payer: Medicaid Other | Admitting: Osteopathic Medicine

## 2017-02-02 ENCOUNTER — Encounter: Payer: Self-pay | Admitting: Obstetrics & Gynecology

## 2017-02-02 ENCOUNTER — Other Ambulatory Visit (HOSPITAL_COMMUNITY)
Admission: RE | Admit: 2017-02-02 | Discharge: 2017-02-02 | Disposition: A | Discharge status: Home / Self Care | Payer: Medicaid Other | Source: Ambulatory Visit | Attending: Obstetrics & Gynecology | Admitting: Obstetrics & Gynecology

## 2017-02-02 ENCOUNTER — Ambulatory Visit (INDEPENDENT_AMBULATORY_CARE_PROVIDER_SITE_OTHER): Payer: Medicaid Other | Admitting: Obstetrics & Gynecology

## 2017-02-02 VITALS — BP 128/72 | HR 92 | Wt 134.0 lb

## 2017-02-02 DIAGNOSIS — O09299 Supervision of pregnancy with other poor reproductive or obstetric history, unspecified trimester: Secondary | ICD-10-CM

## 2017-02-02 DIAGNOSIS — O09291 Supervision of pregnancy with other poor reproductive or obstetric history, first trimester: Secondary | ICD-10-CM | POA: Diagnosis not present

## 2017-02-02 DIAGNOSIS — O99820 Streptococcus B carrier state complicating pregnancy: Secondary | ICD-10-CM

## 2017-02-02 DIAGNOSIS — Z23 Encounter for immunization: Secondary | ICD-10-CM | POA: Diagnosis not present

## 2017-02-02 DIAGNOSIS — Z3480 Encounter for supervision of other normal pregnancy, unspecified trimester: Secondary | ICD-10-CM

## 2017-02-02 MED ORDER — ONDANSETRON HCL 4 MG PO TABS: 4.0000 mg | ORAL_TABLET | Freq: Three times a day (TID) | ORAL | 0 refills | Status: DC | PRN

## 2017-02-02 NOTE — Progress Notes (Signed)
Bedside U/S shows single IUP with FHT of 176 BPM and CRL 37.667mm  GA 3316w1d.  Pt declines genetics and does not want BRX opt sched.

## 2017-02-02 NOTE — Progress Notes (Signed)
Subjective:    Jennifer Ryan is a [id number] [redacted]w[redacted]d being seen today for her first obstetrical visit.  Her obstetrical history is significant for group B strep colonizer and affected infant in the past. Patient does intend to breast feed. Pregnancy history fully reviewed.  Patient reports nausea and heartburn.  Requests zofran for nausea nad will take OTC tums for heartburn.,  Vitals:   02/02/17 1402  BP: 128/72  Pulse: 92  Weight: 134 lb (60.8 kg)    HISTORY: OB History  Gravida Para Term Preterm AB Living  3 2 2     2   SAB TAB Ectopic Multiple Live Births        0 2    # Outcome Date GA Lbr Len/2nd Weight Sex Delivery Anes PTL Lv  3 Current           2 Term 09/02/15 [redacted]w[redacted]d 02:10 / 00:18 6 lb 7.5 oz (2.934 kg) M Vag-Vacuum EPI  LIV  1 Term 02/15/12 [redacted]w[redacted]d 05:09 / 00:52 5 lb 9 oz (2.523 kg) M Vag-Spont None  LIV     Past Medical History:  Diagnosis Date  . 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 Medical history non-contributory   . No pertinent past medical history    Past Surgical History:  Procedure Laterality Date  . WISDOM TOOTH EXTRACTION  2007   Family History  Problem Relation Age of Onset  . Prostate cancer Paternal Grandfather   . Hypertension Paternal Grandfather   . Brain cancer Unknown        pat great grandfather  . Heart attack Paternal Uncle   . Depression Maternal Aunt   . Diabetes Unknown        cousins   . Hyperlipidemia Unknown   . Hypertension Paternal Grandmother      Exam    Uterus:     Pelvic Exam:    Perineum: No Hemorrhoids   Vulva: normal   Vagina:  normal mucosa   pH: n/a   Cervix: no lesions   Adnexa: no mass, fullness, tenderness   Bony Pelvis: gynecoid  System: Breast:  normal appearance, no masses or tenderness   Skin: normal coloration and turgor, no rashes    Neurologic: oriented, normal mood   Extremities: no deformities   HEENT sclera clear, anicteric, oropharynx clear, no lesions, neck supple with midline  trachea, thyroid without masses and trachea midline   Mouth/Teeth mucous membranes moist, pharynx normal without lesions and dental hygiene good   Neck supple and no masses   Cardiovascular: regular rate and rhythm   Respiratory:  appears well, vitals normal, no respiratory distress, acyanotic, normal RR, chest clear, no wheezing, crepitations, rhonchi, normal symmetric air entry   Abdomen: soft, non-tender; bowel sounds normal; no masses,  no organomegaly   Urinary: urethral meatus normal      Assessment:    Pregnancy: [id number] Patient Active Problem List   Diagnosis Date Noted  . Supervision of normal pregnancy in third trimester 09/02/2015  . Hx maternal GBS (group B streptococcus) affected neonate, pregnant 01/24/2015  . 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 Prenatal vitamins. Problem list reviewed and updated. Genetic Screening discussed and declines all testing  Ultrasound discussed; fetal survey: ordered. Declines schedule optimization BRx History of GBS affected infant--treatment in labor; no need to retest this pregnancy Flu shot today.   Follow up in 4 weeks.  Tresa Endo  Keiarah Orlowski 02/02/2017

## 2017-02-03 LAB — OBSTETRIC PANEL
ANTIBODY SCREEN: NOT DETECTED
BASOS PCT: 0.2 %
Basophils Absolute: 22 cells/uL (ref 0–200)
EOS PCT: 1.1 %
Eosinophils Absolute: 119 cells/uL (ref 15–500)
HCT: 36.6 % (ref 35.0–45.0)
HEMOGLOBIN: 12.8 g/dL (ref 11.7–15.5)
Hepatitis B Surface Ag: NONREACTIVE
Lymphs Abs: 1188 cells/uL (ref 850–3900)
MCH: 29.5 pg (ref 27.0–33.0)
MCHC: 35 g/dL (ref 32.0–36.0)
MCV: 84.3 fL (ref 80.0–100.0)
MONOS PCT: 4.2 %
MPV: 9.8 fL (ref 7.5–12.5)
NEUTROS ABS: 9018 {cells}/uL — AB (ref 1500–7800)
Neutrophils Relative %: 83.5 %
Platelets: 341 10*3/uL (ref 140–400)
RBC: 4.34 10*6/uL (ref 3.80–5.10)
RDW: 12.1 % (ref 11.0–15.0)
RPR Ser Ql: NONREACTIVE
Rubella: 2.43 index
TOTAL LYMPHOCYTE: 11 %
WBC mixed population: 454 cells/uL (ref 200–950)
WBC: 10.8 10*3/uL (ref 3.8–10.8)

## 2017-02-03 LAB — HIV ANTIBODY (ROUTINE TESTING W REFLEX): HIV 1&2 Ab, 4th Generation: NONREACTIVE

## 2017-02-06 LAB — URINE CULTURE, OB REFLEX

## 2017-02-06 LAB — CULTURE, OB URINE

## 2017-02-07 LAB — CYTOLOGY - PAP
Chlamydia: NEGATIVE
Diagnosis: NEGATIVE
HPV (WINDOPATH): NOT DETECTED
Neisseria Gonorrhea: NEGATIVE

## 2017-02-15 ENCOUNTER — Encounter: Payer: Self-pay | Admitting: Obstetrics & Gynecology

## 2017-02-26 ENCOUNTER — Encounter: Payer: Self-pay | Admitting: Obstetrics & Gynecology

## 2017-03-07 ENCOUNTER — Telehealth (HOSPITAL_COMMUNITY): Payer: Self-pay

## 2017-03-07 ENCOUNTER — Ambulatory Visit (INDEPENDENT_AMBULATORY_CARE_PROVIDER_SITE_OTHER): Payer: Medicaid Other | Admitting: Obstetrics & Gynecology

## 2017-03-07 ENCOUNTER — Telehealth: Payer: Self-pay | Admitting: *Deleted

## 2017-03-07 ENCOUNTER — Encounter (HOSPITAL_COMMUNITY): Payer: Self-pay | Admitting: *Deleted

## 2017-03-07 DIAGNOSIS — Z3483 Encounter for supervision of other normal pregnancy, third trimester: Secondary | ICD-10-CM

## 2017-03-07 DIAGNOSIS — O021 Missed abortion: Secondary | ICD-10-CM | POA: Diagnosis not present

## 2017-03-07 MED ORDER — ALPRAZOLAM 0.5 MG PO TABS: ORAL_TABLET | ORAL | 0 refills | Status: DC

## 2017-03-07 NOTE — Telephone Encounter (Signed)
Called patient to give her surgery date & time no answer, left voicemail.  Surgery scheduled for 0930 tomorrow 03/08/2017. [facility] NPO after midnight, enter through main entrance, stop at the desk to your right, they will direct you back to where you need to be. No lotion, no perfume, no powder, but a little bit of deodorant is okay. Left patient office phone number and instructions to call back if she had any questions.

## 2017-03-07 NOTE — Telephone Encounter (Signed)
LM on voicemail that her D and C is scheduled for 03/08/17 @ 9:30

## 2017-03-07 NOTE — Telephone Encounter (Signed)
-----   Message from Olevia BowensJacinda S Battle sent at 03/07/2017  3:50 PM EST ----- Regarding: Surgery D&E Posted for 03/08/2017 @0930  w/ Earlene Plateravis CPT [phone]

## 2017-03-07 NOTE — Progress Notes (Signed)
Pt c/o constipation     PRENATAL VISIT NOTE  Subjective:  Jennifer Ryan is a 32 y.o. G3P2002 at 9118w2d being seen today for ongoing prenatal care.  She is currently monitored for the following issues for this low-risk pregnancy and has Cystic fibrosis gene carrier; Hx maternal GBS (group B streptococcus) affected neonate, pregnant; and Supervision of normal pregnancy in third trimester on their problem list.  Patient reports nausea.  Contractions: Not present. Vag. Bleeding: None.  Movement: Absent. Denies leaking of fluid.   The following portions of the patient's history were reviewed and updated as appropriate: allergies, current medications, past family history, past medical history, past social history, past surgical history and problem list. Problem list updated.  Objective:   Vitals:   03/07/17 1411  BP: 117/71  Pulse: 82  Weight: 135 lb (61.2 kg)    Fetal Status:     Movement: Absent     General:  Alert, oriented and cooperative. Patient is in no acute distress.  Skin: Skin is warm and dry. No rash noted.   Cardiovascular: Normal heart rate noted  Respiratory: Normal respiratory effort, no problems with respiration noted  Abdomen: Soft, gravid, appropriate for gestational age.  Pain/Pressure: Absent     Pelvic: Cervical exam deferred        Extremities: Normal range of motion.  Edema: None  Mental Status:  Normal mood and affect. Normal behavior. Normal judgment and thought content.   Assessment and Plan:  Pregnancy: G3P2002 at 6318w2d  1.First trimester Missed Ab Pt presents for routine appt.  No complaints.  Nausea is getting better.  No bleeding.  Pt found to have no FH.  Discussed diagnosis and plan of care.  Pt scheduled for D & E tomorrow at 9:30 am  25 minutes spent face to face with patient with >50% counseling.   Return in about 3 weeks (around 03/28/2017).   Elsie LincolnKelly Jasson Siegmann, MD

## 2017-03-08 ENCOUNTER — Encounter (HOSPITAL_COMMUNITY): Payer: Self-pay

## 2017-03-08 ENCOUNTER — Ambulatory Visit (HOSPITAL_COMMUNITY)
Admission: RE | Admit: 2017-03-08 | Discharge: 2017-03-08 | Disposition: A | Discharge status: Home / Self Care | Payer: Medicaid Other | Source: Ambulatory Visit | Attending: Obstetrics and Gynecology | Admitting: Obstetrics and Gynecology

## 2017-03-08 ENCOUNTER — Ambulatory Visit (HOSPITAL_COMMUNITY): Payer: Medicaid Other | Admitting: Anesthesiology

## 2017-03-08 ENCOUNTER — Other Ambulatory Visit: Payer: Self-pay

## 2017-03-08 ENCOUNTER — Encounter (HOSPITAL_COMMUNITY)
Admission: RE | Disposition: A | Discharge status: Home / Self Care | Payer: Self-pay | Source: Ambulatory Visit | Attending: Obstetrics and Gynecology

## 2017-03-08 ENCOUNTER — Ambulatory Visit (HOSPITAL_COMMUNITY): Payer: Medicaid Other

## 2017-03-08 DIAGNOSIS — Z880 Allergy status to penicillin: Secondary | ICD-10-CM | POA: Insufficient documentation

## 2017-03-08 DIAGNOSIS — Z419 Encounter for procedure for purposes other than remedying health state, unspecified: Secondary | ICD-10-CM

## 2017-03-08 DIAGNOSIS — Z79899 Other long term (current) drug therapy: Secondary | ICD-10-CM | POA: Insufficient documentation

## 2017-03-08 DIAGNOSIS — Z881 Allergy status to other antibiotic agents status: Secondary | ICD-10-CM | POA: Diagnosis not present

## 2017-03-08 DIAGNOSIS — Z141 Cystic fibrosis carrier: Secondary | ICD-10-CM | POA: Insufficient documentation

## 2017-03-08 DIAGNOSIS — Z882 Allergy status to sulfonamides status: Secondary | ICD-10-CM | POA: Insufficient documentation

## 2017-03-08 DIAGNOSIS — Z818 Family history of other mental and behavioral disorders: Secondary | ICD-10-CM | POA: Diagnosis not present

## 2017-03-08 DIAGNOSIS — Z8249 Family history of ischemic heart disease and other diseases of the circulatory system: Secondary | ICD-10-CM | POA: Diagnosis not present

## 2017-03-08 DIAGNOSIS — Z808 Family history of malignant neoplasm of other organs or systems: Secondary | ICD-10-CM | POA: Diagnosis not present

## 2017-03-08 DIAGNOSIS — Z8042 Family history of malignant neoplasm of prostate: Secondary | ICD-10-CM | POA: Diagnosis not present

## 2017-03-08 DIAGNOSIS — Z833 Family history of diabetes mellitus: Secondary | ICD-10-CM | POA: Insufficient documentation

## 2017-03-08 DIAGNOSIS — O021 Missed abortion: Secondary | ICD-10-CM | POA: Insufficient documentation

## 2017-03-08 HISTORY — PX: DILATION AND EVACUATION: SHX1459

## 2017-03-08 LAB — TYPE AND SCREEN
ABO/RH(D): O POS
Antibody Screen: NEGATIVE

## 2017-03-08 LAB — CBC
HEMATOCRIT: 36.2 % (ref 36.0–46.0)
HEMOGLOBIN: 12.5 g/dL (ref 12.0–15.0)
MCH: 29.8 pg (ref 26.0–34.0)
MCHC: 34.5 g/dL (ref 30.0–36.0)
MCV: 86.4 fL (ref 78.0–100.0)
Platelets: 311 10*3/uL (ref 150–400)
RBC: 4.19 MIL/uL (ref 3.87–5.11)
RDW: 13.1 % (ref 11.5–15.5)
WBC: 7.8 10*3/uL (ref 4.0–10.5)

## 2017-03-08 LAB — BASIC METABOLIC PANEL
Anion gap: 9 (ref 5–15)
BUN: 8 mg/dL (ref 6–20)
CHLORIDE: 109 mmol/L (ref 101–111)
CO2: 19 mmol/L — AB (ref 22–32)
CREATININE: 0.49 mg/dL (ref 0.44–1.00)
Calcium: 8.8 mg/dL — ABNORMAL LOW (ref 8.9–10.3)
GFR calc non Af Amer: >60 mL/min (ref >60)
Glucose, Bld: 99 mg/dL (ref 65–99)
Potassium: 3.4 mmol/L — ABNORMAL LOW (ref 3.5–5.1)
Sodium: 137 mmol/L (ref 135–145)

## 2017-03-08 SURGERY — DILATION AND EVACUATION, UTERUS
Anesthesia: Monitor Anesthesia Care

## 2017-03-08 MED ORDER — MEPERIDINE HCL 25 MG/ML IJ SOLN: 6.2500 mg | INTRAMUSCULAR | Status: DC | PRN

## 2017-03-08 MED ORDER — METOCLOPRAMIDE HCL 5 MG/ML IJ SOLN
INTRAMUSCULAR | Status: DC | PRN
  Administered 2017-03-08 (×2): 5 mg via INTRAVENOUS

## 2017-03-08 MED ORDER — DOXYCYCLINE HYCLATE 100 MG IV SOLR
200.0000 mg | INTRAVENOUS | Status: DC
  Filled 2017-03-08: qty 200

## 2017-03-08 MED ORDER — DEXAMETHASONE SODIUM PHOSPHATE 10 MG/ML IJ SOLN
INTRAMUSCULAR | Status: AC
  Filled 2017-03-08: qty 1

## 2017-03-08 MED ORDER — MISOPROSTOL 200 MCG PO TABS
200.0000 ug | ORAL_TABLET | Freq: Once | ORAL | Status: AC
  Administered 2017-03-08: 200 ug via BUCCAL
  Filled 2017-03-08: qty 1

## 2017-03-08 MED ORDER — HYDROMORPHONE HCL 1 MG/ML IJ SOLN
INTRAMUSCULAR | Status: AC
  Filled 2017-03-08: qty 1

## 2017-03-08 MED ORDER — KETOROLAC TROMETHAMINE 30 MG/ML IJ SOLN
INTRAMUSCULAR | Status: AC
  Filled 2017-03-08: qty 1

## 2017-03-08 MED ORDER — FENTANYL CITRATE (PF) 100 MCG/2ML IJ SOLN
INTRAMUSCULAR | Status: AC
  Filled 2017-03-08: qty 4

## 2017-03-08 MED ORDER — ONDANSETRON HCL 4 MG/2ML IJ SOLN
INTRAMUSCULAR | Status: AC
  Filled 2017-03-08: qty 2

## 2017-03-08 MED ORDER — PROPOFOL 10 MG/ML IV BOLUS
INTRAVENOUS | Status: AC
  Filled 2017-03-08: qty 20

## 2017-03-08 MED ORDER — KETOROLAC TROMETHAMINE 30 MG/ML IJ SOLN
30.0000 mg | Freq: Once | INTRAMUSCULAR | Status: DC | PRN
Start: 2017-03-08 — End: 2017-03-08

## 2017-03-08 MED ORDER — DOXYCYCLINE HYCLATE 100 MG IV SOLR
INTRAVENOUS | Status: DC | PRN
  Administered 2017-03-08: 200 mg via INTRAVENOUS

## 2017-03-08 MED ORDER — DIPHENHYDRAMINE HCL 50 MG/ML IJ SOLN
INTRAMUSCULAR | Status: AC
  Filled 2017-03-08: qty 1

## 2017-03-08 MED ORDER — FENTANYL CITRATE (PF) 100 MCG/2ML IJ SOLN: 25.0000 ug | INTRAMUSCULAR | Status: DC | PRN

## 2017-03-08 MED ORDER — OXYCODONE HCL 5 MG PO TABS: 5.0000 mg | ORAL_TABLET | ORAL | 0 refills | Status: DC | PRN

## 2017-03-08 MED ORDER — IBUPROFEN 800 MG PO TABS: 800.0000 mg | ORAL_TABLET | Freq: Three times a day (TID) | ORAL | 0 refills | Status: DC | PRN

## 2017-03-08 MED ORDER — DIPHENHYDRAMINE HCL 50 MG/ML IJ SOLN
INTRAMUSCULAR | Status: DC | PRN
  Administered 2017-03-08 (×2): 6.25 mg via INTRAVENOUS

## 2017-03-08 MED ORDER — ACETAMINOPHEN 325 MG PO TABS: 325.0000 mg | ORAL_TABLET | ORAL | Status: DC | PRN

## 2017-03-08 MED ORDER — ACETAMINOPHEN 160 MG/5ML PO SOLN: 325.0000 mg | ORAL | Status: DC | PRN

## 2017-03-08 MED ORDER — OXYCODONE HCL 5 MG/5ML PO SOLN: 5.0000 mg | Freq: Once | ORAL | Status: DC | PRN

## 2017-03-08 MED ORDER — ONDANSETRON HCL 4 MG/2ML IJ SOLN: 4.0000 mg | Freq: Once | INTRAMUSCULAR | Status: DC | PRN

## 2017-03-08 MED ORDER — HYDROMORPHONE HCL 1 MG/ML IJ SOLN
INTRAMUSCULAR | Status: DC | PRN
Start: 2017-03-08 — End: 2017-03-08
  Administered 2017-03-08 (×2): 0.5 mg via INTRAVENOUS

## 2017-03-08 MED ORDER — LACTATED RINGERS IV SOLN
INTRAVENOUS | Status: DC
  Administered 2017-03-08: 11:00:00 via INTRAVENOUS
  Administered 2017-03-08: 125 mL/h via INTRAVENOUS

## 2017-03-08 MED ORDER — SCOPOLAMINE 1 MG/3DAYS TD PT72
MEDICATED_PATCH | TRANSDERMAL | Status: AC
  Administered 2017-03-08: 1.5 mg via TRANSDERMAL
  Filled 2017-03-08: qty 1

## 2017-03-08 MED ORDER — DOXYCYCLINE HYCLATE 100 MG PO TABS
200.0000 mg | ORAL_TABLET | Freq: Once | ORAL | Status: DC
  Filled 2017-03-08: qty 2

## 2017-03-08 MED ORDER — PROPOFOL 10 MG/ML IV BOLUS
INTRAVENOUS | Status: DC | PRN
  Administered 2017-03-08: 30 mg via INTRAVENOUS
  Administered 2017-03-08: 20 mg via INTRAVENOUS

## 2017-03-08 MED ORDER — PROPOFOL 10 MG/ML IV BOLUS
INTRAVENOUS | Status: AC
  Filled 2017-03-08: qty 40

## 2017-03-08 MED ORDER — FENTANYL CITRATE (PF) 100 MCG/2ML IJ SOLN
INTRAMUSCULAR | Status: DC | PRN
  Administered 2017-03-08 (×4): 50 ug via INTRAVENOUS

## 2017-03-08 MED ORDER — SCOPOLAMINE 1 MG/3DAYS TD PT72
1.0000 | MEDICATED_PATCH | Freq: Once | TRANSDERMAL | Status: DC
  Administered 2017-03-08: 1.5 mg via TRANSDERMAL

## 2017-03-08 MED ORDER — GLYCOPYRROLATE 0.2 MG/ML IJ SOLN
INTRAMUSCULAR | Status: DC | PRN
  Administered 2017-03-08: 0.1 mg via INTRAVENOUS

## 2017-03-08 MED ORDER — MIDAZOLAM HCL 2 MG/2ML IJ SOLN
INTRAMUSCULAR | Status: AC
  Filled 2017-03-08: qty 2

## 2017-03-08 MED ORDER — KETOROLAC TROMETHAMINE 30 MG/ML IJ SOLN
INTRAMUSCULAR | Status: DC | PRN
  Administered 2017-03-08: 30 mg via INTRAVENOUS

## 2017-03-08 MED ORDER — METOCLOPRAMIDE HCL 5 MG/ML IJ SOLN
INTRAMUSCULAR | Status: AC
  Filled 2017-03-08: qty 2

## 2017-03-08 MED ORDER — NORGESTIMATE-ETH ESTRADIOL 0.25-35 MG-MCG PO TABS: 1.0000 | ORAL_TABLET | Freq: Every day | ORAL | 3 refills | Status: DC

## 2017-03-08 MED ORDER — PROPOFOL 500 MG/50ML IV EMUL
INTRAVENOUS | Status: DC | PRN
  Administered 2017-03-08: 100 ug/kg/min via INTRAVENOUS

## 2017-03-08 MED ORDER — LIDOCAINE HCL (CARDIAC) 20 MG/ML IV SOLN
INTRAVENOUS | Status: AC
  Filled 2017-03-08: qty 5

## 2017-03-08 MED ORDER — DEXAMETHASONE SODIUM PHOSPHATE 4 MG/ML IJ SOLN
INTRAMUSCULAR | Status: DC | PRN
  Administered 2017-03-08: 10 mg via INTRAVENOUS

## 2017-03-08 MED ORDER — LIDOCAINE HCL (CARDIAC) 20 MG/ML IV SOLN
INTRAVENOUS | Status: DC | PRN
  Administered 2017-03-08: 80 mg via INTRAVENOUS

## 2017-03-08 MED ORDER — SODIUM CHLORIDE 0.9 % IJ SOLN
INTRAMUSCULAR | Status: AC
  Filled 2017-03-08: qty 10

## 2017-03-08 MED ORDER — FLUMAZENIL 0.5 MG/5ML IV SOLN
INTRAVENOUS | Status: AC
  Filled 2017-03-08: qty 5

## 2017-03-08 MED ORDER — DOXYCYCLINE HYCLATE 100 MG PO TABS
100.0000 mg | ORAL_TABLET | Freq: Once | ORAL | Status: AC
  Administered 2017-03-08: 100 mg via ORAL

## 2017-03-08 MED ORDER — ONDANSETRON HCL 4 MG/2ML IJ SOLN
INTRAMUSCULAR | Status: DC | PRN
  Administered 2017-03-08: 4 mg via INTRAVENOUS

## 2017-03-08 MED ORDER — MIDAZOLAM HCL 2 MG/2ML IJ SOLN
INTRAMUSCULAR | Status: DC | PRN
  Administered 2017-03-08 (×2): 1 mg via INTRAVENOUS

## 2017-03-08 MED ORDER — OXYCODONE HCL 5 MG PO TABS: 5.0000 mg | ORAL_TABLET | Freq: Once | ORAL | Status: DC | PRN

## 2017-03-08 SURGICAL SUPPLY — 20 items
CATH ROBINSON RED A/P 16FR (CATHETERS) ×3 IMPLANT
DECANTER SPIKE VIAL GLASS SM (MISCELLANEOUS) ×3 IMPLANT
GLOVE BIO SURGEON STRL SZ 6.5 (GLOVE) ×2 IMPLANT
GLOVE BIO SURGEONS STRL SZ 6.5 (GLOVE) ×1
GLOVE BIOGEL PI IND STRL 6.5 (GLOVE) ×1 IMPLANT
GLOVE BIOGEL PI IND STRL 7.0 (GLOVE) ×1 IMPLANT
GLOVE BIOGEL PI INDICATOR 6.5 (GLOVE) ×2
GLOVE BIOGEL PI INDICATOR 7.0 (GLOVE) ×2
GOWN STRL REUS W/TWL LRG LVL3 (GOWN DISPOSABLE) ×6 IMPLANT
KIT BERKELEY 1ST TRIMESTER 3/8 (MISCELLANEOUS) ×3 IMPLANT
NS IRRIG 1000ML POUR BTL (IV SOLUTION) ×3 IMPLANT
PACK VAGINAL MINOR WOMEN LF (CUSTOM PROCEDURE TRAY) ×3 IMPLANT
PAD OB MATERNITY 4.3X12.25 (PERSONAL CARE ITEMS) ×3 IMPLANT
PAD PREP 24X48 CUFFED NSTRL (MISCELLANEOUS) ×3 IMPLANT
SET BERKELEY SUCTION TUBING (SUCTIONS) ×3 IMPLANT
TOWEL OR 17X24 6PK STRL BLUE (TOWEL DISPOSABLE) ×6 IMPLANT
VACURETTE 10 RIGID CVD (CANNULA) ×3 IMPLANT
VACURETTE 7MM CVD STRL WRAP (CANNULA) IMPLANT
VACURETTE 8 RIGID CVD (CANNULA) IMPLANT
VACURETTE 9 RIGID CVD (CANNULA) ×3 IMPLANT

## 2017-03-08 NOTE — Transfer of Care (Signed)
Immediate Anesthesia Transfer of Care Note  Patient: Jennifer Ryan  Procedure(s) Performed: DILATATION AND EVACUATION with IntraOperative Ultrasound (N/A )  Patient Location: PACU  Anesthesia Type:MAC  Level of Consciousness: awake, alert  and oriented  Airway & Oxygen Therapy: Patient Spontanous Breathing  Post-op Assessment: Report given to RN and Post -op Vital signs reviewed and stable  Post vital signs: Reviewed and stable  Last Vitals:  Vitals:   03/08/17 0853  BP: 116/65  Pulse: 84  Resp: 16  Temp: 37.4 C  SpO2: 100%    Last Pain:  Vitals:   03/08/17 0853  TempSrc: Oral      Patients Stated Pain Goal: 3 (03/08/17 0853)  Pain is rated at 2.  Complications: No apparent anesthesia complications

## 2017-03-08 NOTE — Anesthesia Preprocedure Evaluation (Signed)
Anesthesia Evaluation  Patient identified by MRN, date of birth, ID band Patient awake    Reviewed: Allergy & Precautions, H&P , NPO status , Patient's Chart, lab work & pertinent test results  Airway Mallampati: I  TM Distance: >3 FB Neck ROM: full    Dental no notable dental hx. (+) Teeth Intact   Pulmonary neg pulmonary ROS,    Pulmonary exam normal breath sounds clear to auscultation       Cardiovascular negative cardio ROS Normal cardiovascular exam Rhythm:regular Rate:Normal     Neuro/Psych negative neurological ROS  negative psych ROS   GI/Hepatic negative GI ROS, Neg liver ROS,   Endo/Other  negative endocrine ROS  Renal/GU negative Renal ROS  negative genitourinary   Musculoskeletal negative musculoskeletal ROS (+)   Abdominal Normal abdominal exam  (+)   Peds  Hematology negative hematology ROS (+)   Anesthesia Other Findings   Reproductive/Obstetrics (+) Pregnancy                             Anesthesia Physical Anesthesia Plan  ASA: II  Anesthesia Plan: MAC   Post-op Pain Management:    Induction:   PONV Risk Score and Plan: 3 and Ondansetron, Dexamethasone, Propofol infusion and Scopolamine patch - Pre-op  Airway Management Planned: Natural Airway and Nasal Cannula  Additional Equipment:   Intra-op Plan:   Post-operative Plan:   Informed Consent: I have reviewed the patients History and Physical, chart, labs and discussed the procedure including the risks, benefits and alternatives for the proposed anesthesia with the patient or authorized representative who has indicated his/her understanding and acceptance.     Plan Discussed with: CRNA and Surgeon  Anesthesia Plan Comments:         Anesthesia Quick Evaluation

## 2017-03-08 NOTE — Discharge Instructions (Signed)
Dilation and Curettage, Care After  These instructions give you information about caring for yourself after your procedure. Your doctor may also give you more specific instructions. Call your doctor if you have any problems or questions after your procedure. Follow these instructions at home: Activity  Do not drive or use heavy machinery while taking prescription pain medicine.  For 24 hours after your procedure, avoid driving.  Take short walks often, followed by rest periods. Ask your doctor what activities are safe for you. After one or two days, you may be able to return to your normal activities.  Do not lift anything that is heavier than 10 lb (4.5 kg) until your doctor approves.  For at least 2 weeks, or as long as told by your doctor: ? Do not douche. ? Do not use tampons. ? Do not have sex. General instructions  Take over-the-counter and prescription medicines only as told by your doctor. This is very important if you take blood thinning medicine.  Do not take baths, swim, or use a hot tub until your doctor approves. Take showers instead of baths.  Wear compression stockings as told by your doctor.  It is up to you to get the results of your procedure. Ask your doctor when your results will be ready.  Keep all follow-up visits as told by your doctor. This is important. Contact a doctor if:  You have very bad cramps that get worse or do not get better with medicine.  You have very bad pain in your belly (abdomen).  You cannot drink fluids without throwing up (vomiting).  You get pain in a different part of the area between your belly and thighs (pelvis).  You have bad-smelling discharge from your vagina.  You have a rash. Get help right away if:  You are bleeding a lot from your vagina. A lot of bleeding means soaking more than one sanitary pad in an hour, for 2 hours in a row.  You have clumps of blood (blood clots) coming from your vagina.  You have a fever  or chills.  Your belly feels very tender or hard.  You have chest pain.  You have trouble breathing.  You cough up blood.  You feel dizzy.  You feel light-headed.  You pass out (faint).  You have pain in your neck or shoulder area.  If you had a scopolamine patch placed behind your ear for the management of post- operative nausea and/or vomiting:  1. The medication in the patch is effective for 72 hours, after which it should be removed.  Wrap patch in a tissue and discard in the trash. Wash hands thoroughly with soap and water. 2. You may remove the patch earlier than 72 hours if you experience unpleasant side effects which may include dry mouth, dizziness or visual disturbances. 3. Avoid touching the patch. Wash your hands with soap and water after contact with the patch.  Summary  Take short walks often, followed by rest periods. Ask your doctor what activities are safe for you. After one or two days, you may be able to return to your normal activities.  Do not lift anything that is heavier than 10 lb (4.5 kg) until your doctor approves.  Do not take baths, swim, or use a hot tub until your doctor approves. Take showers instead of baths.  Contact your doctor if you have any symptoms of infection, like bad-smelling discharge from your vagina. This information is not intended to replace advice given to  you by your health care provider. Make sure you discuss any questions you have with your health care provider. Document Released: 10/27/2007 Document Revised: 10/05/2015 Document Reviewed: 10/05/2015

## 2017-03-08 NOTE — Anesthesia Postprocedure Evaluation (Signed)
Anesthesia Post Note  Patient: Jennifer Ryan  Procedure(s) Performed: DILATATION AND EVACUATION with IntraOperative Ultrasound (N/A )     Patient location during evaluation: PACU Anesthesia Type: MAC Level of consciousness: awake Pain management: pain level controlled Vital Signs Assessment: post-procedure vital signs reviewed and stable Respiratory status: spontaneous breathing Cardiovascular status: stable Postop Assessment: no apparent nausea or vomiting Anesthetic complications: no    Last Vitals:  Vitals:   03/08/17 1130 03/08/17 1145  BP: (!) 115/46 (!) 113/55  Pulse: 84 76  Resp: 12 20  Temp:    SpO2: 97% 98%    Last Pain:  Vitals:   03/08/17 1145  TempSrc:   PainSc: 2    Pain Goal: Patients Stated Pain Goal: 3 (03/08/17 1145)               Lamar

## 2017-03-08 NOTE — H&P (Signed)
OB/GYN History and Physical  Jennifer Ryan is a 32 y.o. [id number] presenting for surgical management of missed abortion. She reports no bleeding, denies pain. Reports she had some intermittent mild RLQ cramping in the last few weeks. She is understandably upset about miscarriage.     Past Medical History:  Diagnosis Date  . 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 Medical history non-contributory   . No pertinent past medical history     Past Surgical History:  Procedure Laterality Date  . WISDOM TOOTH EXTRACTION  2007    OB History  Gravida Para Term Preterm AB Living  3 2 2     2   SAB TAB Ectopic Multiple Live Births        0 2    # Outcome Date GA Lbr Len/2nd Weight Sex Delivery Anes PTL Lv  3 Current           2 Term 09/02/15 10253w3d 02:10 / 00:18 6 lb 7.5 oz (2.934 kg) M Vag-Vacuum EPI  LIV  1 Term 02/15/12 1764w6d 05:09 / 00:52 5 lb 9 oz (2.523 kg) M Vag-Spont None  LIV      Social History   Socioeconomic History  . Marital status: Married    Spouse name: Jennifer Ryan  . Number of children: 1   . Years of education: Not on file  . Highest education level: Not on file  Social Needs  . Financial resource strain: Not on file  . Food insecurity - worry: Not on file  . Food insecurity - inability: Not on file  . Transportation needs - medical: Not on file  . Transportation needs - non-medical: Not on file  Occupational History  . Occupation: Psychologist, counsellingharmacy Tech     Employer: [employer]    Comment: [employer]  Tobacco Use  . Smoking status: Never Smoker  . Smokeless tobacco: Never Used  Substance and Sexual Activity  . Alcohol use: No  . Drug use: No  . Sexual activity: Yes    Partners: Male    Birth control/protection: None  Other Topics Concern  . Not on file  Social History Narrative    2 caffiene per day. No regular exercise routine.     Family History  Problem Relation Age of Onset  . Prostate cancer Paternal Grandfather   . Hypertension  Paternal Grandfather   . Brain cancer Unknown        pat great grandfather  . Heart attack Paternal Uncle   . Depression Maternal Aunt   . Diabetes Unknown        cousins   . Hyperlipidemia Unknown   . Hypertension Paternal Grandmother     Medications Prior to Admission  Medication Sig Dispense Refill Last Dose  . ALPRAZolam (XANAX) 0.5 MG tablet Take one table tid prn anxiety. (Patient taking differently: Take 0.5 mg by mouth 3 (three) times daily as needed for anxiety. ) 12 tablet 0   . docusate sodium (COLACE) 100 MG capsule Take 100 mg by mouth daily as needed for mild constipation.       Allergies  Allergen Reactions  . Bactrim [Sulfamethoxazole-Trimethoprim] Rash  . Penicillins Rash and Other (See Comments)    Has patient had a PCN reaction causing immediate rash, facial/tongue/throat swelling, SOB or lightheadedness with hypotension: No Has patient had a PCN reaction causing severe rash involving mucus membranes or skin necrosis: No Has patient had a PCN reaction that required hospitalization No Has patient had a  PCN reaction occurring within the last 10 years: No If all of the above answers are "NO", then may proceed with Cephalosporin use.  . Sulfa Antibiotics Rash    Review of Systems: Negative except for what is mentioned in HPI.     Physical Exam: LMP 11/14/2016  CONSTITUTIONAL: Well-developed, well-nourished female in no acute distress. Mildly tearful with interview  HENT:  Normocephalic, atraumatic, External right and left ear normal. Oropharynx is clear and moist EYES: Conjunctivae and EOM are normal. Pupils are equal, round, and reactive to light. No scleral icterus.  NECK: Normal range of motion, supple, no masses SKIN: Skin is warm and dry. No rash noted. Not diaphoretic. No erythema. No pallor. NEUROLGIC: Alert and oriented to person, place, and time. Normal reflexes, muscle tone coordination. No cranial nerve deficit noted. PSYCHIATRIC: Normal mood and  affect. Normal behavior. Normal judgment and thought content. CARDIOVASCULAR: Normal heart rate noted, regular rhythm RESPIRATORY: Effort and breath sounds normal, no problems with respiration noted ABDOMEN: Soft, nontender, nondistended, gravid.  PELVIC: Deferred MUSCULOSKELETAL: Normal range of motion. No edema and no tenderness. 2+ distal pulses.   Pertinent Labs/Studies:   No results found for this or any previous visit (from the past 72 hour(s)).   Per RN from [facility] visit yesterday, fetus measuring approx 12 weeks.     Assessment and Plan :Jennifer Ryan is a 32 y.o. [id number] admitted for surgical management of missed abortion at approx 12 weeks. Options were discussed in office yesterday. The risks of suction D&C were reviewed with the patient; including but not limited to: infection which may require antibiotics; bleeding which may require transfusion or re-operation; injury to bowel, bladder, ureters or other surrounding organs; thromboembolic phenomenon and other postoperative/anesthesia complications. The patient concurred with the proposed plan, giving informed consent for the procedure. She is agreeable to a blood transfusion in the event of emergency.   Plan for D&E for missed abortion NPO Admission labs ordered VS per unit routine Doxycycline ordered cytotec buccal 200 mcg   Jennifer Ryan, M.D. Attending Obstetrician & Gynecologist, [facility] for [employer], [facility]

## 2017-03-08 NOTE — Op Note (Signed)
Jennifer Ryan PROCEDURE DATE:  03/08/2017  PREOPERATIVE DIAGNOSIS: missed abortion @ 12 weeks POSTOPERATIVE DIAGNOSIS: The same PROCEDURE:  Dilation and evacuation under ultrasound guidance SURGEON:  Dr. Baldemar LenisK. Meryl Davis  INDICATIONS: 32 y.o.  [id number] presenting with missed abortion, who desires surgical management.  Risks of surgery were discussed with the patient and her friend including but not limited to: bleeding which may require transfusion; infection which may require antibiotics; injury to uterus or surrounding organs; need for additional procedures including laparotomy or laparoscopy; possibility of intrauterine scarring which may impair future fertility; and other postoperative/anesthesia complications. Written informed consent was obtained.  FINDINGS:  Uterus approx 10 weeks sized. Intact membranes consistent with missed abortion. Significant amount of products of conception within uterus. Empty endometrial stripe noted on ultrasound at the end of the procedure.   ANESTHESIA:    Monitored intravenous sedation INTRAVENOUS FLUIDS:  1000ml of LR ESTIMATED BLOOD LOSS:  50 ml. SPECIMENS:  Products of conception sent for genetic testing COMPLICATIONS:  None immediate.  PROCEDURE DETAILS:  The patient received intravenous Doxycycline while in the preoperative area.  She was then taken to the operating room whereanesthesia  was administered and was found to be adequate.  After an adequate timeout was performed, she was placed in the dorsal lithotomy position and examined; then prepped and draped in the sterile manner.   Her bladder was catheterized for an unmeasured amount of clear, yellow urine. A vaginal speculum was then placed in the patient's vagina and a single tooth tenaculum was applied to the anterior lip of the cervix.  The cervix was gently dilated under to accommodate a 9 mm suction curette that was gently advanced to the uterine fundus.  The suction device was then activated and  curette slowly rotated to clear the uterus of products of conception.  This was repeated until the endometrial cavity was cleared and a clean stripe was noted on ultrasound. A sharp curettage was then performed to confirm complete emptying of the uterus. There was minimal bleeding noted at the end of the procedure, and the tenaculum removed with some bleeding at site, good hemostasis noted with application of silver nitrate.     All instruments were removed from the patient's vagina.  Sponge and instrument counts were correct times three.  The patient tolerated the procedure well and was taken to the recovery area awake, extubated and in stable condition.  The patient will be discharged to home as per PACU criteria.  Routine postoperative instructions given.  She was prescribed Percocet, Ibuprofen and Colace.  She will follow up in the clinic in 2-3 weeks for postoperative evaluation.   Baldemar LenisK. Meryl Davis, M.D. Attending Obstetrician & Gynecologist, [facility] for [employer], [facility]

## 2017-03-09 ENCOUNTER — Encounter (HOSPITAL_COMMUNITY): Payer: Self-pay | Admitting: Obstetrics and Gynecology

## 2017-03-16 ENCOUNTER — Encounter: Payer: Self-pay | Admitting: *Deleted

## 2017-03-16 LAB — TISSUE HYBRIDIZATION TO NCBH

## 2017-03-23 ENCOUNTER — Encounter: Payer: Self-pay | Admitting: Obstetrics and Gynecology

## 2017-03-23 ENCOUNTER — Ambulatory Visit (INDEPENDENT_AMBULATORY_CARE_PROVIDER_SITE_OTHER): Payer: Medicaid Other | Admitting: Obstetrics and Gynecology

## 2017-03-23 VITALS — BP 102/71 | HR 73 | Wt 131.0 lb

## 2017-03-23 DIAGNOSIS — Z9889 Other specified postprocedural states: Secondary | ICD-10-CM

## 2017-03-23 NOTE — Progress Notes (Signed)
Pt states that the vaginal bleeding stopped Tuesday. Pt had blood in her stool Monday.

## 2017-03-23 NOTE — Progress Notes (Signed)
32 yo G3P2012 s/p D&E of missed ab on 03/08/2017. Patient reports feeling well. She denies fever, chills, or pelvic pain. She denies any persistent vaginal bleeding. Patient received COC which she plans to start with her next period. She is currently without complaints  Past Medical History:  Diagnosis Date  . 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 Medical history non-contributory   . No pertinent past medical history    Past Surgical History:  Procedure Laterality Date  . DILATION AND EVACUATION N/A 03/08/2017   Procedure: DILATATION AND EVACUATION with IntraOperative Ultrasound;  Surgeon: Conan Bowensavis, Kelly M, MD;  Location: [facility];  Service: Gynecology;  Laterality: N/A;  . WISDOM TOOTH EXTRACTION  2007   Family History  Problem Relation Age of Onset  . Prostate cancer Paternal Grandfather   . Hypertension Paternal Grandfather   . Brain cancer Unknown        pat great grandfather  . Heart attack Paternal Uncle   . Depression Maternal Aunt   . Diabetes Unknown        cousins   . Hyperlipidemia Unknown   . Hypertension Paternal Grandmother    Social History   Tobacco Use  . Smoking status: Never Smoker  . Smokeless tobacco: Never Used  Substance Use Topics  . Alcohol use: No  . Drug use: No   ROS See pertinent in HPI  Blood pressure 102/71, pulse 73, weight 131 lb (59.4 kg), last menstrual period 11/14/2016, not currently breastfeeding. GENERAL: Well-developed, well-nourished female in no acute distress.  ABDOMEN: Soft, nontender, nondistended. No organomegaly. PELVIC: Normal external female genitalia. Vagina is pink and rugated.  Normal discharge. Normal appearing cervix. Uterus is normal in size.  No adnexal mass or tenderness. EXTREMITIES: No cyanosis, clubbing, or edema, 2+ distal pulses.  A/P 32 yo here for post op check s/p D&E on 03/08/2017 under ultrasound guidance - FISH results reviewed with the patient - Patient is medically cleared to resume all activities of daily  living - Patient to continue prenatal vitamins and start COC - She plans to conceive again this summer

## 2017-03-29 ENCOUNTER — Ambulatory Visit (HOSPITAL_COMMUNITY): Payer: Medicaid Other

## 2017-03-30 ENCOUNTER — Encounter: Payer: Self-pay | Admitting: Obstetrics & Gynecology

## 2017-04-03 ENCOUNTER — Encounter: Payer: Self-pay | Admitting: *Deleted

## 2017-04-03 LAB — CHROMOSOME STD, POC(TISSUE)-NCBH

## 2017-04-14 ENCOUNTER — Encounter: Payer: Self-pay | Admitting: *Deleted

## 2018-01-22 ENCOUNTER — Encounter: Payer: Self-pay | Admitting: Obstetrics & Gynecology

## 2018-01-22 ENCOUNTER — Ambulatory Visit (INDEPENDENT_AMBULATORY_CARE_PROVIDER_SITE_OTHER): Payer: Medicaid Other | Admitting: Obstetrics & Gynecology

## 2018-01-22 ENCOUNTER — Encounter: Payer: Self-pay | Admitting: *Deleted

## 2018-01-22 ENCOUNTER — Other Ambulatory Visit (HOSPITAL_COMMUNITY)
Admission: RE | Admit: 2018-01-22 | Discharge: 2018-01-22 | Disposition: A | Discharge status: Home / Self Care | Payer: Medicaid Other | Source: Ambulatory Visit | Attending: Obstetrics & Gynecology | Admitting: Obstetrics & Gynecology

## 2018-01-22 VITALS — BP 120/60 | HR 100 | Wt 142.0 lb

## 2018-01-22 DIAGNOSIS — Z141 Cystic fibrosis carrier: Secondary | ICD-10-CM

## 2018-01-22 DIAGNOSIS — Z3481 Encounter for supervision of other normal pregnancy, first trimester: Secondary | ICD-10-CM

## 2018-01-22 DIAGNOSIS — O09299 Supervision of pregnancy with other poor reproductive or obstetric history, unspecified trimester: Secondary | ICD-10-CM

## 2018-01-22 DIAGNOSIS — O09291 Supervision of pregnancy with other poor reproductive or obstetric history, first trimester: Secondary | ICD-10-CM | POA: Diagnosis not present

## 2018-01-22 DIAGNOSIS — Z23 Encounter for immunization: Secondary | ICD-10-CM | POA: Diagnosis not present

## 2018-01-22 DIAGNOSIS — Z3480 Encounter for supervision of other normal pregnancy, unspecified trimester: Secondary | ICD-10-CM | POA: Insufficient documentation

## 2018-01-22 NOTE — Progress Notes (Signed)
  Subjective:    Jennifer Ryan is being seen today for her first obstetrical visit.  This is a planned pregnancy. She is at 7369w2d gestation. Her obstetrical history is significant for none. Relationship with FOB: spouse, living together. Patient does intend to breast feed. Pregnancy history fully reviewed.  Patient reports no complaints.  Review of Systems:   Review of Systems  Objective:     BP 120/60   Pulse 100   Wt 142 lb (64.4 kg)   LMP 11/11/2017   BMI 27.73 kg/m  Physical Exam  Exam  Heart- rrr Lungs- CTAB Abd- benign Bedside u/s shows a normal FHR Pap normal 1/19   Assessment:    Pregnancy: [id number] Patient Active Problem List   Diagnosis Date Noted  . Supervision of other normal pregnancy, antepartum 09/02/2015  . Hx maternal GBS (group B streptococcus) affected neonate, pregnant 01/24/2015  . Cystic fibrosis gene carrier 07/17/2011       Plan:     Initial labs drawn. Prenatal vitamins. Problem list reviewed and updated. AFP3 discussed: requested. Role of ultrasound in pregnancy discussed; fetal survey: requested. Amniocentesis discussed: not indicated. Follow up in 2 weeks for NIPS Flu vaccine today    Allie BossierMyra C Carlethia Mesquita 01/22/2018

## 2018-01-22 NOTE — Progress Notes (Signed)
Bedside U/S shows single IUP with FHT of 196 BPM and CRL measures 29.633mm  GA is 7255w5d

## 2018-01-23 LAB — URINE CYTOLOGY ANCILLARY ONLY
Chlamydia: NEGATIVE
Neisseria Gonorrhea: NEGATIVE

## 2018-01-24 LAB — URINE CULTURE, OB REFLEX

## 2018-01-24 LAB — CULTURE, OB URINE

## 2018-02-05 ENCOUNTER — Other Ambulatory Visit: Payer: Self-pay | Admitting: Obstetrics and Gynecology

## 2018-02-05 ENCOUNTER — Ambulatory Visit (INDEPENDENT_AMBULATORY_CARE_PROVIDER_SITE_OTHER): Payer: Medicaid Other | Admitting: Obstetrics & Gynecology

## 2018-02-05 ENCOUNTER — Other Ambulatory Visit: Payer: Self-pay

## 2018-02-05 ENCOUNTER — Encounter: Payer: Self-pay | Admitting: *Deleted

## 2018-02-05 ENCOUNTER — Encounter (HOSPITAL_BASED_OUTPATIENT_CLINIC_OR_DEPARTMENT_OTHER): Payer: Self-pay | Admitting: *Deleted

## 2018-02-05 VITALS — BP 127/70 | HR 102 | Wt 144.0 lb

## 2018-02-05 DIAGNOSIS — Z3480 Encounter for supervision of other normal pregnancy, unspecified trimester: Secondary | ICD-10-CM

## 2018-02-05 DIAGNOSIS — O021 Missed abortion: Secondary | ICD-10-CM | POA: Diagnosis not present

## 2018-02-05 NOTE — Progress Notes (Signed)
Spoke w/ pt via phone for pre-op interview.  Npo after mn.  Arrive at 0930.  Pre-op orders pending, add-on.

## 2018-02-05 NOTE — Progress Notes (Signed)
   Subjective:    Patient ID: Jennifer Ryan, female    DOB: 1985/08/26, 33 y.o.   MRN: [id number]  HPI  33 year old G4 para 2-0-1-2 female presents for routine OB.  There was found to be no fetal heart rate in the fetal crown-rump length was consistent with 10 weeks.  Patient is not having any bleeding or cramping.  Patient has been having nausea vomiting.  Patient is surprised and obviously saddened by this diagnosis.  Review of Systems  Constitutional: Negative.   Respiratory: Negative.   Cardiovascular: Negative.   Gastrointestinal: Positive for nausea and vomiting.  Genitourinary: Negative.   Musculoskeletal: Negative.   Psychiatric/Behavioral: Negative.        Objective:   Physical Exam Vitals signs reviewed.  Constitutional:      General: She is not in acute distress.    Appearance: She is well-developed.  HENT:     Head: Normocephalic and atraumatic.  Eyes:     Conjunctiva/sclera: Conjunctivae normal.  Cardiovascular:     Rate and Rhythm: Normal rate.  Pulmonary:     Effort: Pulmonary effort is normal.  Abdominal:     General: Abdomen is flat.     Palpations: Abdomen is soft.  Skin:    General: Skin is warm and dry.  Neurological:     Mental Status: She is alert and oriented to person, place, and time.    Assessment & Plan:  33 year old female presents for routine OB and found to have missed ab.  1.  Antiphospholipid antibody work-up.  TSH and fasting glucose today. 2.  Genetics referral at [facility]. 3.  Pt prefers D & E on Wednesday or Thursday at 9:30 or after (due to childcare) 4.  Will discuss case with Dr. Jeannie Fend.  (Pt has Medicaid, does not take) 5.  Surgery scheduled with Dr. Jolayne Panther tomorrow at 11:45.    25 minutes spent face to face with patient with greater than 50% counseling.

## 2018-02-05 NOTE — Progress Notes (Signed)
Bedside U/S shows IUP without FHT.  CRL measures 35.74mm GA showing only [redacted]w[redacted]d. Genetic counselor appt with MFM Tuesday 02/13/18 @ 9 AM

## 2018-02-06 ENCOUNTER — Ambulatory Visit (HOSPITAL_BASED_OUTPATIENT_CLINIC_OR_DEPARTMENT_OTHER): Payer: Medicaid Other | Admitting: Certified Registered"

## 2018-02-06 ENCOUNTER — Encounter (HOSPITAL_BASED_OUTPATIENT_CLINIC_OR_DEPARTMENT_OTHER)
Admission: RE | Disposition: A | Discharge status: Home / Self Care | Payer: Self-pay | Source: Ambulatory Visit | Attending: Obstetrics and Gynecology

## 2018-02-06 ENCOUNTER — Ambulatory Visit (HOSPITAL_BASED_OUTPATIENT_CLINIC_OR_DEPARTMENT_OTHER)
Admission: RE | Admit: 2018-02-06 | Discharge: 2018-02-06 | Disposition: A | Discharge status: Home / Self Care | Payer: Medicaid Other | Source: Ambulatory Visit | Attending: Obstetrics and Gynecology | Admitting: Obstetrics and Gynecology

## 2018-02-06 ENCOUNTER — Encounter (HOSPITAL_BASED_OUTPATIENT_CLINIC_OR_DEPARTMENT_OTHER): Payer: Self-pay | Admitting: Certified Registered"

## 2018-02-06 ENCOUNTER — Other Ambulatory Visit: Payer: Self-pay | Admitting: Obstetrics and Gynecology

## 2018-02-06 DIAGNOSIS — O021 Missed abortion: Secondary | ICD-10-CM | POA: Diagnosis not present

## 2018-02-06 HISTORY — DX: Gastro-esophageal reflux disease without esophagitis: K21.9

## 2018-02-06 HISTORY — PX: DILATION AND EVACUATION: SHX1459

## 2018-02-06 HISTORY — DX: Missed abortion: O02.1

## 2018-02-06 HISTORY — DX: Presence of spectacles and contact lenses: Z97.3

## 2018-02-06 LAB — OBSTETRIC PANEL
ANTIBODY SCREEN: NOT DETECTED
Absolute Monocytes: 332 cells/uL (ref 200–950)
Basophils Absolute: 39 cells/uL (ref 0–200)
Basophils Relative: 0.6 %
Eosinophils Absolute: 182 cells/uL (ref 15–500)
Eosinophils Relative: 2.8 %
HCT: 36.9 % (ref 35.0–45.0)
HEP B S AG: NONREACTIVE
Hemoglobin: 12.8 g/dL (ref 11.7–15.5)
Lymphs Abs: 1229 cells/uL (ref 850–3900)
MCH: 29.6 pg (ref 27.0–33.0)
MCHC: 34.7 g/dL (ref 32.0–36.0)
MCV: 85.4 fL (ref 80.0–100.0)
MPV: 9.8 fL (ref 7.5–12.5)
Monocytes Relative: 5.1 %
Neutro Abs: 4719 cells/uL (ref 1500–7800)
Neutrophils Relative %: 72.6 %
Platelets: 323 10*3/uL (ref 140–400)
RBC: 4.32 10*6/uL (ref 3.80–5.10)
RDW: 12 % (ref 11.0–15.0)
RPR Ser Ql: NONREACTIVE
Rubella: 3.33 index
Total Lymphocyte: 18.9 %
WBC: 6.5 10*3/uL (ref 3.8–10.8)

## 2018-02-06 LAB — CBC
HCT: 40.5 % (ref 36.0–46.0)
Hemoglobin: 13.3 g/dL (ref 12.0–15.0)
MCH: 28.9 pg (ref 26.0–34.0)
MCHC: 32.8 g/dL (ref 30.0–36.0)
MCV: 88 fL (ref 80.0–100.0)
Platelets: 320 10*3/uL (ref 150–400)
RBC: 4.6 MIL/uL (ref 3.87–5.11)
RDW: 12.5 % (ref 11.5–15.5)
WBC: 8.7 10*3/uL (ref 4.0–10.5)
nRBC: 0 % (ref 0.0–0.2)

## 2018-02-06 LAB — HIV ANTIBODY (ROUTINE TESTING W REFLEX): HIV 1&2 Ab, 4th Generation: NONREACTIVE

## 2018-02-06 SURGERY — DILATION AND EVACUATION, UTERUS
Anesthesia: Monitor Anesthesia Care | Site: Vagina

## 2018-02-06 MED ORDER — FENTANYL CITRATE (PF) 100 MCG/2ML IJ SOLN
INTRAMUSCULAR | Status: AC
  Filled 2018-02-06: qty 2

## 2018-02-06 MED ORDER — DEXAMETHASONE SODIUM PHOSPHATE 10 MG/ML IJ SOLN
INTRAMUSCULAR | Status: DC | PRN
  Administered 2018-02-06: 10 mg via INTRAVENOUS

## 2018-02-06 MED ORDER — LIDOCAINE 2% (20 MG/ML) 5 ML SYRINGE
INTRAMUSCULAR | Status: DC | PRN
  Administered 2018-02-06: 50 mg via INTRAVENOUS

## 2018-02-06 MED ORDER — DEXAMETHASONE SODIUM PHOSPHATE 10 MG/ML IJ SOLN
INTRAMUSCULAR | Status: AC
  Filled 2018-02-06: qty 1

## 2018-02-06 MED ORDER — MIDAZOLAM HCL 2 MG/2ML IJ SOLN
INTRAMUSCULAR | Status: DC | PRN
  Administered 2018-02-06: 2 mg via INTRAVENOUS

## 2018-02-06 MED ORDER — OXYCODONE-ACETAMINOPHEN 5-325 MG PO TABS: 1.0000 | ORAL_TABLET | ORAL | 0 refills | Status: DC | PRN

## 2018-02-06 MED ORDER — MIDAZOLAM HCL 2 MG/2ML IJ SOLN
INTRAMUSCULAR | Status: AC
  Filled 2018-02-06: qty 2

## 2018-02-06 MED ORDER — ONDANSETRON HCL 4 MG/2ML IJ SOLN
INTRAMUSCULAR | Status: AC
  Filled 2018-02-06: qty 2

## 2018-02-06 MED ORDER — KETOROLAC TROMETHAMINE 30 MG/ML IJ SOLN
INTRAMUSCULAR | Status: AC
  Filled 2018-02-06: qty 1

## 2018-02-06 MED ORDER — CHLOROPROCAINE HCL 1 % IJ SOLN
INTRAMUSCULAR | Status: DC | PRN
  Administered 2018-02-06: 10 mL

## 2018-02-06 MED ORDER — PROMETHAZINE HCL 25 MG/ML IJ SOLN
6.2500 mg | INTRAMUSCULAR | Status: DC | PRN
  Filled 2018-02-06: qty 1

## 2018-02-06 MED ORDER — OXYCODONE HCL 5 MG PO TABS
ORAL_TABLET | ORAL | Status: AC
  Filled 2018-02-06: qty 1

## 2018-02-06 MED ORDER — PROPOFOL 10 MG/ML IV BOLUS
INTRAVENOUS | Status: AC
  Filled 2018-02-06: qty 40

## 2018-02-06 MED ORDER — OXYCODONE HCL 5 MG PO TABS
5.0000 mg | ORAL_TABLET | Freq: Once | ORAL | Status: AC
  Administered 2018-02-06: 5 mg via ORAL
  Filled 2018-02-06: qty 1

## 2018-02-06 MED ORDER — KETOROLAC TROMETHAMINE 30 MG/ML IJ SOLN
INTRAMUSCULAR | Status: DC | PRN
  Administered 2018-02-06: 30 mg via INTRAVENOUS

## 2018-02-06 MED ORDER — ONDANSETRON HCL 4 MG/2ML IJ SOLN
INTRAMUSCULAR | Status: DC | PRN
  Administered 2018-02-06: 4 mg via INTRAVENOUS

## 2018-02-06 MED ORDER — LIDOCAINE 2% (20 MG/ML) 5 ML SYRINGE
INTRAMUSCULAR | Status: AC
  Filled 2018-02-06: qty 5

## 2018-02-06 MED ORDER — DOXYCYCLINE HYCLATE 100 MG IV SOLR
200.0000 mg | INTRAVENOUS | Status: AC
  Administered 2018-02-06: 200 mg via INTRAVENOUS
  Filled 2018-02-06 (×2): qty 200

## 2018-02-06 MED ORDER — DOCUSATE SODIUM 100 MG PO CAPS: 100.0000 mg | ORAL_CAPSULE | Freq: Two times a day (BID) | ORAL | 2 refills | Status: DC | PRN

## 2018-02-06 MED ORDER — FENTANYL CITRATE (PF) 100 MCG/2ML IJ SOLN
INTRAMUSCULAR | Status: DC | PRN
  Administered 2018-02-06 (×2): 25 ug via INTRAVENOUS
  Administered 2018-02-06: 50 ug via INTRAVENOUS

## 2018-02-06 MED ORDER — LACTATED RINGERS IV SOLN
INTRAVENOUS | Status: DC
  Administered 2018-02-06 (×2): via INTRAVENOUS
  Filled 2018-02-06: qty 1000

## 2018-02-06 MED ORDER — FENTANYL CITRATE (PF) 100 MCG/2ML IJ SOLN
25.0000 ug | INTRAMUSCULAR | Status: DC | PRN
  Administered 2018-02-06: 25 ug via INTRAVENOUS
  Filled 2018-02-06: qty 1

## 2018-02-06 MED ORDER — PROPOFOL 10 MG/ML IV BOLUS
INTRAVENOUS | Status: DC | PRN
  Administered 2018-02-06: 150 mg via INTRAVENOUS

## 2018-02-06 MED ORDER — IBUPROFEN 600 MG PO TABS: 600.0000 mg | ORAL_TABLET | Freq: Four times a day (QID) | ORAL | 3 refills | Status: DC | PRN

## 2018-02-06 SURGICAL SUPPLY — 22 items
CATH ROBINSON RED A/P 16FR (CATHETERS) ×3 IMPLANT
COVER WAND RF STERILE (DRAPES) ×3 IMPLANT
DECANTER SPIKE VIAL GLASS SM (MISCELLANEOUS) ×3 IMPLANT
GLOVE BIOGEL PI IND STRL 6.5 (GLOVE) ×1 IMPLANT
GLOVE BIOGEL PI IND STRL 7.0 (GLOVE) ×1 IMPLANT
GLOVE BIOGEL PI INDICATOR 6.5 (GLOVE) ×2
GLOVE BIOGEL PI INDICATOR 7.0 (GLOVE) ×2
GLOVE SURG SS PI 6.0 STRL IVOR (GLOVE) ×3 IMPLANT
GOWN STRL REUS W/TWL LRG LVL3 (GOWN DISPOSABLE) ×6 IMPLANT
KIT BERKELEY 1ST TRIMESTER 3/8 (MISCELLANEOUS) ×3 IMPLANT
NS IRRIG 1000ML POUR BTL (IV SOLUTION) ×3 IMPLANT
PACK VAGINAL MINOR WOMEN LF (CUSTOM PROCEDURE TRAY) ×3 IMPLANT
PAD OB MATERNITY 4.3X12.25 (PERSONAL CARE ITEMS) ×3 IMPLANT
PAD PREP 24X48 CUFFED NSTRL (MISCELLANEOUS) ×3 IMPLANT
SET BERKELEY SUCTION TUBING (SUCTIONS) ×3 IMPLANT
TOWEL OR 17X24 6PK STRL BLUE (TOWEL DISPOSABLE) ×6 IMPLANT
VACURETTE 10 RIGID CVD (CANNULA) IMPLANT
VACURETTE 12 RIGID CVD (CANNULA) ×2 IMPLANT
VACURETTE 6 ASPIR F TIP BERK (CANNULA) IMPLANT
VACURETTE 7MM CVD STRL WRAP (CANNULA) IMPLANT
VACURETTE 8 RIGID CVD (CANNULA) IMPLANT
VACURETTE 9 RIGID CVD (CANNULA) IMPLANT

## 2018-02-06 NOTE — Op Note (Addendum)
Jennifer Ryan PROCEDURE DATE: 02/06/2018  PREOPERATIVE DIAGNOSIS: 10 week missed abortion. POSTOPERATIVE DIAGNOSIS: The same. PROCEDURE:     Dilation and Evacuation. SURGEON:  Dr. Catalina Antigua  INDICATIONS: 33 y.o. G4P2002with MAB at [redacted] weeks gestation, needing surgical completion.  Risks of surgery were discussed with the patient including but not limited to: bleeding which may require transfusion; infection which may require antibiotics; injury to uterus or surrounding organs;need for additional procedures including laparotomy or laparoscopy; possibility of intrauterine scarring which may impair future fertility; and other postoperative/anesthesia complications. Written informed consent was obtained.    FINDINGS:  A 12-week size anteverted uterus, moderate amounts of products of conception, specimen sent to pathology.  ANESTHESIA:    Monitored intravenous sedation, paracervical block. INTRAVENOUS FLUIDS:  700 ml of LR ESTIMATED BLOOD LOSS:  Less than 20 ml. SPECIMENS:  Products of conception sent to pathology and genetic testing COMPLICATIONS:  None immediate.  PROCEDURE DETAILS:  The patient received intravenous antibiotics while in the preoperative area.  She was then taken to the operating room where general anesthesia was administered and was found to be adequate.  After an adequate timeout was performed, she was placed in the dorsal lithotomy position and examined; then prepped and draped in the sterile manner.   Her bladder was catheterized for an unmeasured amount of clear, yellow urine. A vaginal speculum was then placed in the patient's vagina and a single tooth tenaculum was applied to the anterior lip of the cervix.  A paracervical block using 0.5% Marcaine was administered. The cervix was gently dilated to accommodate a 12 mm suction curette that was gently advanced to the uterine fundus.  The suction device was then activated and curette slowly rotated to clear the uterus of  products of conception.  A sharp curettage was then performed to confirm complete emptying of the uterus. There was minimal bleeding noted and the tenaculum removed with good hemostasis noted.   All instruments were removed from the patient's vagina. The patient tolerated the procedure well and was taken to the recovery area awake, and in stable condition.  The patient will be discharged to home as per PACU criteria.  Routine postoperative instructions given.  She was prescribed Percocet, Ibuprofen and Colace.  She will follow up in the clinic in 2 weeks for postoperative evaluation.

## 2018-02-06 NOTE — H&P (Signed)
Jennifer Ryan is an 33 y.o. female [phone] at 4w3 by LMP here for scheduled dilatation and evacuation due to missed abortion. Patient was seen on 02/05/2017 for routine ob when an ultrasound demonstrated a 10 week IUP without cardiac activity. Patient denies any vaginal bleeding or cramping pain.  Pertinent Gynecological History: Menses: regular every month without intermenstrual spotting DES exposure: denies Blood transfusions: none Sexually transmitted diseases: no past history Previous GYN Procedures: DNC  Last pap: normal Date: 01/2017 OB History: G4, P2012   Menstrual History: Patient's last menstrual period was 11/11/2017.    Past Medical History:  Diagnosis Date  . 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 GERD (gastroesophageal reflux disease)    occasional , no otc meds  . Missed ab   . Wears glasses     Past Surgical History:  Procedure Laterality Date  . DILATION AND EVACUATION N/A 03/08/2017   Procedure: DILATATION AND EVACUATION with IntraOperative Ultrasound;  Surgeon: Conan Bowens, MD;  Location: [facility];  Service: Gynecology;  Laterality: N/A;  . WISDOM TOOTH EXTRACTION  2007    Family History  Problem Relation Age of Onset  . Prostate cancer Paternal Grandfather   . Hypertension Paternal Grandfather   . Brain cancer Other        pat great grandfather  . Heart attack Paternal Uncle   . Depression Maternal Aunt   . Diabetes Other        cousins   . Hyperlipidemia Other   . Hypertension Paternal Grandmother     Social History:  reports that she has never smoked. She has never used smokeless tobacco. She reports that she does not drink alcohol or use drugs.  Allergies:  Allergies  Allergen Reactions  . Bactrim [Sulfamethoxazole-Trimethoprim] Rash  . Penicillins Rash and Other (See Comments)    Has patient had a PCN reaction causing immediate rash, facial/tongue/throat swelling, SOB or lightheadedness with hypotension: No Has patient had a PCN  reaction causing severe rash involving mucus membranes or skin necrosis: No Has patient had a PCN reaction that required hospitalization No Has patient had a PCN reaction occurring within the last 10 years: No If all of the above answers are "NO", then may proceed with Cephalosporin use.  . Sulfa Antibiotics Rash    Medications Prior to Admission  Medication Sig Dispense Refill Last Dose  . Prenatal Multivit-Min-Fe-FA (PRENATAL VITAMINS PO) Take 1 tablet by mouth daily.    Taking    ROS See pertinent in HPI Blood pressure 133/83, pulse 81, temperature 98.4 F (36.9 C), temperature source Oral, resp. rate 16, height 5' 0.5" (1.537 m), weight 65 kg, last menstrual period 11/11/2017, SpO2 100 %. Physical Exam GENERAL: Well-developed, well-nourished female in no acute distress.  LUNGS: Clear to auscultation bilaterally.  HEART: Regular rate and rhythm. ABDOMEN: Soft, nontender, nondistended. No organomegaly. PELVIC: Deferred to OR EXTREMITIES: No cyanosis, clubbing, or edema, 2+ distal pulses.  No results found for this or any previous visit (from the past 24 hour(s)).  No results found.  Assessment/Plan: 33 yo P2012 with 10 week missed abortion her for scheduled dilatation and evacuation - Risks, benefits and alternatives were explained including but not limited to risks of bleeding, infection, uterine perforation and damage to adjacent organs. Patient verbalized understanding and all questions were answered.  Cliff Damiani 02/06/2018, 9:58 AM

## 2018-02-06 NOTE — Transfer of Care (Signed)
Immediate Anesthesia Transfer of Care Note  Patient: Jennifer HalstedHeather J Ryan  Procedure(s) Performed: Procedure(s) (LRB): DILATATION AND EVACUATION (N/A)  Patient Location: PACU  Anesthesia Type: General  Level of Consciousness: awake, oriented, sedated and patient cooperative  Airway & Oxygen Therapy: Patient Spontanous Breathing and Patient connected to face mask oxygen  Post-op Assessment: Report given to PACU RN and Post -op Vital signs reviewed and stable  Post vital signs: Reviewed and stable  Complications: No apparent anesthesia complications  Last Vitals:  Vitals Value Taken Time  BP 115/66 02/06/2018 11:05 AM  Temp    Pulse 72 02/06/2018 11:07 AM  Resp 13 02/06/2018 11:07 AM  SpO2 100 % 02/06/2018 11:07 AM  Vitals shown include unvalidated device data.  Last Pain:  Vitals:   02/06/18 1004  TempSrc:   PainSc: 0-No pain      Patients Stated Pain Goal: 5 (02/06/18 1004)

## 2018-02-06 NOTE — Anesthesia Preprocedure Evaluation (Signed)
Anesthesia Evaluation  Patient identified by MRN, date of birth, ID band Patient awake    Reviewed: Allergy & Precautions, H&P , NPO status , Patient's Chart, lab work & pertinent test results  History of Anesthesia Complications Negative for: history of anesthetic complications  Airway Mallampati: I  TM Distance: >3 FB Neck ROM: full    Dental no notable dental hx. (+) Teeth Intact   Pulmonary neg pulmonary ROS,    Pulmonary exam normal breath sounds clear to auscultation       Cardiovascular negative cardio ROS Normal cardiovascular exam Rhythm:regular Rate:Normal     Neuro/Psych negative neurological ROS  negative psych ROS   GI/Hepatic negative GI ROS, Neg liver ROS,   Endo/Other  negative endocrine ROS  Renal/GU negative Renal ROS  negative genitourinary   Musculoskeletal negative musculoskeletal ROS (+)   Abdominal Normal abdominal exam  (+)   Peds  Hematology negative hematology ROS (+)   Anesthesia Other Findings   Reproductive/Obstetrics                             Anesthesia Physical  Anesthesia Plan  ASA: II  Anesthesia Plan: General and MAC   Post-op Pain Management:    Induction:   PONV Risk Score and Plan: 3 and Ondansetron, Dexamethasone, Propofol infusion and Scopolamine patch - Pre-op  Airway Management Planned:   Additional Equipment:   Intra-op Plan:   Post-operative Plan:   Informed Consent:   Plan Discussed with: CRNA and Surgeon  Anesthesia Plan Comments:         Anesthesia Quick Evaluation

## 2018-02-06 NOTE — Anesthesia Postprocedure Evaluation (Signed)
Anesthesia Post Note  Patient: Jennifer Ryan  Procedure(s) Performed: DILATATION AND EVACUATION (N/A Vagina )     Patient location during evaluation: PACU Anesthesia Type: MAC Level of consciousness: awake Pain management: pain level controlled Vital Signs Assessment: post-procedure vital signs reviewed and stable Respiratory status: spontaneous breathing Cardiovascular status: stable Postop Assessment: no apparent nausea or vomiting Anesthetic complications: no    Last Vitals:  Vitals:   02/06/18 1145 02/06/18 1200  BP: (!) 107/56 104/60  Pulse: 76 67  Resp: 13 15  Temp:    SpO2: 100% 99%    Last Pain:  Vitals:   02/06/18 1200  TempSrc:   PainSc: 4    Pain Goal: Patients Stated Pain Goal: 5 (02/06/18 1004)               Huston Foley

## 2018-02-06 NOTE — Discharge Instructions (Signed)
Dilation and Curettage or Vacuum Curettage ° °Dilation and curettage (D&C) and vacuum curettage are minor procedures. A D&C involves stretching (dilation) the cervix and scraping (curettage) the inside lining of the uterus (endometrium). During a D&C, tissue is gently scraped from the endometrium, starting from the top portion of the uterus down to the lowest part of the uterus (cervix). During a vacuum curettage, the lining and tissue in the uterus are removed with the use of gentle suction. °Curettage may be performed to either diagnose or treat a problem. As a diagnostic procedure, curettage is performed to examine tissues from the uterus. A diagnostic curettage may be done if you have: °· Irregular bleeding in the uterus. °· Bleeding with the development of clots. °· Spotting between menstrual periods. °· Prolonged menstrual periods or other abnormal bleeding. °· Bleeding after menopause. °· No menstrual period (amenorrhea). °· A change in size and shape of the uterus. °· Abnormal endometrial cells discovered during a Pap test. °As a treatment procedure, curettage may be performed for the following reasons: °· Removal of an IUD (intrauterine device). °· Removal of retained placenta after giving birth. °· Abortion. °· Miscarriage. °· Removal of endometrial polyps. °· Removal of uncommon types of noncancerous lumps (fibroids). °Tell a health care provider about: °· Any allergies you have, including allergies to prescribed medicine or latex. °· All medicines you are taking, including vitamins, herbs, eye drops, creams, and over-the-counter medicines. This is especially important if you take any blood-thinning medicine. Bring a list of all of your medicines to your appointment. °· Any problems you or family members have had with anesthetic medicines. °· Any blood disorders you have. °· Any surgeries you have had. °· Your medical history and any medical conditions you have. °· Whether you are pregnant or may be  pregnant. °· Recent vaginal infections you have had. °· Recent menstrual periods, bleeding problems you have had, and what form of birth control (contraception) you use. °What are the risks? °Generally, this is a safe procedure. However, problems may occur, including: °· Infection. °· Heavy vaginal bleeding. °· Allergic reactions to medicines. °· Damage to the cervix or other structures or organs. °· Development of scar tissue (adhesions) inside the uterus, which can cause abnormal amounts of menstrual bleeding. This may make it harder to get pregnant in the future. °· A hole (perforation) or puncture in the uterine wall. This is rare. °What happens before the procedure? °Staying hydrated °Follow instructions from your health care provider about hydration, which may include: °· Up to 2 hours before the procedure - you may continue to drink clear liquids, such as water, clear fruit juice, black coffee, and plain tea. °Eating and drinking restrictions °Follow instructions from your health care provider about eating and drinking, which may include: °· 8 hours before the procedure - stop eating heavy meals or foods such as meat, fried foods, or fatty foods. °· 6 hours before the procedure - stop eating light meals or foods, such as toast or cereal. °· 6 hours before the procedure - stop drinking milk or drinks that contain milk. °· 2 hours before the procedure - stop drinking clear liquids. If your health care provider told you to take your medicine(s) on the day of your procedure, take them with only a sip of water. °Medicines °· Ask your health care provider about: °? Changing or stopping your regular medicines. This is especially important if you are taking diabetes medicines or blood thinners. °? Taking medicines such as aspirin   and ibuprofen. These medicines can thin your blood. Do not take these medicines before your procedure if your health care provider instructs you not to.  You may be given antibiotic  medicine to help prevent infection. General instructions  For 24 hours before your procedure, do not: ? Douche. ? Use tampons. ? Use medicines, creams, or suppositories in the vagina. ? Have sexual intercourse.  You may be given a pregnancy test on the day of the procedure.  Plan to have someone take you home from the hospital or clinic.  You may have a blood or urine sample taken.  If you will be going home right after the procedure, plan to have someone with you for 24 hours. What happens during the procedure?  To reduce your risk of infection: ? Your health care team will wash or sanitize their hands. ? Your skin will be washed with soap.  An IV tube will be inserted into one of your veins.  You will be given one of the following: ? A medicine that numbs the area in and around the cervix (local anesthetic). ? A medicine to make you fall asleep (general anesthetic).  You will lie down on your back, with your feet in foot rests (stirrups).  The size and position of your uterus will be checked.  A lubricated instrument (speculum or Sims retractor) will be inserted into the back side of your vagina. The speculum will be used to hold apart the walls of your vagina so your health care provider can see your cervix.  A tool (tenaculum) will be attached to the lip of the cervix to stabilize it.  Your cervix will be softened and dilated. This may be done by: ? Taking a medicine. ? Having tapered dilators or thin rods (laminaria) or gradual widening instruments (tapered dilators) inserted into your cervix.  A small, sharp, curved instrument (curette) will be used to scrape a small amount of tissue or cells from the endometrium or cervical canal. In some cases, gentle suction is applied with the curette. The curette will then be removed. The cells will be taken to a lab for testing. The procedure may vary among health care providers and hospitals. What happens after the  procedure?  You may have mild cramping, backache, pain, and light bleeding or spotting. You may pass small blood clots from your vagina.  You may have to wear compression stockings. These stockings help to prevent blood clots and reduce swelling in your legs.  Your blood pressure, heart rate, breathing rate, and blood oxygen level will be monitored until the medicines you were given have worn off. Summary  Dilation and curettage (D&C) involves stretching (dilation) the cervix and scraping (curettage) the inside lining of the uterus (endometrium).  After the procedure, you may have mild cramping, backache, pain, and light bleeding or spotting. You may pass small blood clots from your vagina.  Plan to have someone take you home from the hospital or clinic. This information is not intended to replace advice given to you by your health care provider. Make sure you discuss any questions you have with your health care provider. Document Released: 01/17/2005 Document Revised: 10/04/2015 Document Reviewed: 10/04/2015 Elsevier Interactive Patient Education  2019 [employer].    Post Anesthesia Home Care Instructions  Activity: Get plenty of rest for the remainder of the day. A responsible individual must stay with you for 24 hours following the procedure.  For the next 24 hours, DO NOT: -Drive a car -Operate  machinery -Drink alcoholic beverages -Take any medication unless instructed by your physician -Make any legal decisions or sign important papers.  Meals: Start with liquid foods such as gelatin or soup. Progress to regular foods as tolerated. Avoid greasy, spicy, heavy foods. If nausea and/or vomiting occur, drink only clear liquids until the nausea and/or vomiting subsides. Call your physician if vomiting continues.  Special Instructions/Symptoms: Your throat may feel dry or sore from the anesthesia or the breathing tube placed in your throat during surgery. If this causes  discomfort, gargle with warm salt water. The discomfort should disappear within 24 hours.  If you had a scopolamine patch placed behind your ear for the management of post- operative nausea and/or vomiting:  1. The medication in the patch is effective for 72 hours, after which it should be removed.  Wrap patch in a tissue and discard in the trash. Wash hands thoroughly with soap and water. 2. You may remove the patch earlier than 72 hours if you experience unpleasant side effects which may include dry mouth, dizziness or visual disturbances. 3. Avoid touching the patch. Wash your hands with soap and water after contact with the patch.

## 2018-02-07 ENCOUNTER — Encounter (HOSPITAL_BASED_OUTPATIENT_CLINIC_OR_DEPARTMENT_OTHER): Payer: Self-pay | Admitting: Obstetrics and Gynecology

## 2018-02-07 LAB — BETA-2 GLYCOPROTEIN ANTIBODIES: Beta-2 Glyco 1 IgA: <9 SAU (ref <=20)

## 2018-02-07 LAB — LUPUS ANTICOAGULANT EVAL W/ REFLEX
PTT-LA Screen: 35 s (ref <=40)
dRVVT: 33 s (ref <=45)

## 2018-02-07 LAB — CARDIOLIPIN ANTIBODY: PHOSPHOLIPIDS: 230 mg/dL (ref 151–264)

## 2018-02-09 ENCOUNTER — Other Ambulatory Visit: Payer: Self-pay

## 2018-02-09 ENCOUNTER — Inpatient Hospital Stay (HOSPITAL_COMMUNITY)
Admission: AD | Admit: 2018-02-09 | Discharge: 2018-02-09 | Disposition: A | Discharge status: Home / Self Care | Payer: Medicaid Other | Attending: Family Medicine | Admitting: Family Medicine

## 2018-02-09 ENCOUNTER — Encounter (HOSPITAL_COMMUNITY): Payer: Self-pay

## 2018-02-09 DIAGNOSIS — N939 Abnormal uterine and vaginal bleeding, unspecified: Secondary | ICD-10-CM | POA: Insufficient documentation

## 2018-02-09 DIAGNOSIS — Z880 Allergy status to penicillin: Secondary | ICD-10-CM | POA: Insufficient documentation

## 2018-02-09 DIAGNOSIS — Z9889 Other specified postprocedural states: Secondary | ICD-10-CM

## 2018-02-09 LAB — WET PREP, GENITAL
Clue Cells Wet Prep HPF POC: NONE SEEN
Sperm: NONE SEEN
Trich, Wet Prep: NONE SEEN
Yeast Wet Prep HPF POC: NONE SEEN

## 2018-02-09 LAB — CBC
HCT: 34.9 % — ABNORMAL LOW (ref 36.0–46.0)
Hemoglobin: 11.7 g/dL — ABNORMAL LOW (ref 12.0–15.0)
MCH: 29.2 pg (ref 26.0–34.0)
MCHC: 33.5 g/dL (ref 30.0–36.0)
MCV: 87 fL (ref 80.0–100.0)
Platelets: 299 10*3/uL (ref 150–400)
RBC: 4.01 MIL/uL (ref 3.87–5.11)
RDW: 12.4 % (ref 11.5–15.5)
WBC: 7.5 10*3/uL (ref 4.0–10.5)
nRBC: 0 % (ref 0.0–0.2)

## 2018-02-09 LAB — URINALYSIS, ROUTINE W REFLEX MICROSCOPIC
Bilirubin Urine: NEGATIVE
Glucose, UA: NEGATIVE mg/dL
Ketones, ur: NEGATIVE mg/dL
Leukocytes, UA: NEGATIVE
Nitrite: NEGATIVE
Protein, ur: NEGATIVE mg/dL
RBC / HPF: >50 RBC/hpf — ABNORMAL HIGH (ref 0–5)
SPECIFIC GRAVITY, URINE: 1.01 (ref 1.005–1.030)
pH: 6 (ref 5.0–8.0)

## 2018-02-09 MED ORDER — ONDANSETRON HCL 4 MG PO TABS: 4.0000 mg | ORAL_TABLET | Freq: Three times a day (TID) | ORAL | 0 refills | Status: DC | PRN

## 2018-02-09 MED ORDER — LACTATED RINGERS IV BOLUS
1000.0000 mL | Freq: Once | INTRAVENOUS | Status: AC
  Administered 2018-02-09: 1000 mL via INTRAVENOUS

## 2018-02-09 NOTE — Discharge Instructions (Signed)
Dilation and Curettage or Vacuum Curettage, Care After  These instructions give you information about caring for yourself after your procedure. Your doctor may also give you more specific instructions. Call your doctor if you have any problems or questions after your procedure.  Follow these instructions at home:  Activity  · Do not drive or use heavy machinery while taking prescription pain medicine.  · For 24 hours after your procedure, avoid driving.  · Take short walks often, followed by rest periods. Ask your doctor what activities are safe for you. After one or two days, you may be able to return to your normal activities.  · Do not lift anything that is heavier than 10 lb (4.5 kg) until your doctor approves.  · For at least 2 weeks, or as long as told by your doctor:  ? Do not douche.  ? Do not use tampons.  ? Do not have sex.  General instructions    · Take over-the-counter and prescription medicines only as told by your doctor. This is very important if you take blood thinning medicine.  · Do not take baths, swim, or use a hot tub until your doctor approves. Take showers instead of baths.  · Wear compression stockings as told by your doctor.  · It is up to you to get the results of your procedure. Ask your doctor when your results will be ready.  · Keep all follow-up visits as told by your doctor. This is important.  Contact a doctor if:  · You have very bad cramps that get worse or do not get better with medicine.  · You have very bad pain in your belly (abdomen).  · You cannot drink fluids without throwing up (vomiting).  · You get pain in a different part of the area between your belly and thighs (pelvis).  · You have bad-smelling discharge from your vagina.  · You have a rash.  Get help right away if:  · You are bleeding a lot from your vagina. A lot of bleeding means soaking more than one sanitary pad in an hour, for 2 hours in a row.  · You have clumps of blood (blood clots) coming from your  vagina.  · You have a fever or chills.  · Your belly feels very tender or hard.  · You have chest pain.  · You have trouble breathing.  · You cough up blood.  · You feel dizzy.  · You feel light-headed.  · You pass out (faint).  · You have pain in your neck or shoulder area.  Summary  · Take short walks often, followed by rest periods. Ask your doctor what activities are safe for you. After one or two days, you may be able to return to your normal activities.  · Do not lift anything that is heavier than 10 lb (4.5 kg) until your doctor approves.  · Do not take baths, swim, or use a hot tub until your doctor approves. Take showers instead of baths.  · Contact your doctor if you have any symptoms of infection, like bad-smelling discharge from your vagina.  This information is not intended to replace advice given to you by your health care provider. Make sure you discuss any questions you have with your health care provider.  Document Released: 10/27/2007 Document Revised: 10/05/2015 Document Reviewed: 10/05/2015  Elsevier Interactive Patient Education © 2019 [employer]vier Inc[employer]

## 2018-02-09 NOTE — MAU Note (Signed)
Assisted patient and husband in standing position for orthostatic vitals. Patient didn't feel able to stand for second set.

## 2018-02-09 NOTE — MAU Provider Note (Signed)
History     CSN: [id number]  Arrival date and time: 02/09/18 2563   First Provider Initiated Contact with Patient 02/09/18 1715      Chief Complaint  Patient presents with  . Vaginal Bleeding  . Abdominal Pain  . Back Pain   33 y.o. female 3 days s/p D&C for 10 wk MAB presenting with increased VB and passing clots this afternoon. Before today bleeding had been small. Reports passing a large amt of blood and fist sized clots in toilet. LAP worsened today, rates 5/10. She took Oxycodone and Ibuprofen which helped. She was unable to eat much today d/t poor appetite. Has felt hot and cold but hasn't checked temp.   OB History    Gravida  4   Para  2   Term  2   Preterm      AB  2   Living  2     SAB      TAB  2   Ectopic      Multiple  0   Live Births  2           Past Medical History:  Diagnosis Date  . 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 GERD (gastroesophageal reflux disease)    occasional , no otc meds  . Missed ab   . Wears glasses     Past Surgical History:  Procedure Laterality Date  . DILATION AND EVACUATION N/A 03/08/2017   Procedure: DILATATION AND EVACUATION with IntraOperative Ultrasound;  Surgeon: Conan Bowens, MD;  Location: [facility];  Service: Gynecology;  Laterality: N/A;  . DILATION AND EVACUATION N/A 02/06/2018   Procedure: DILATATION AND EVACUATION;  Surgeon: Catalina Antigua, MD;  Location: [facility];  Service: Gynecology;  Laterality: N/A;  . WISDOM TOOTH EXTRACTION  2007    Family History  Problem Relation Age of Onset  . Prostate cancer Paternal Grandfather   . Hypertension Paternal Grandfather   . Brain cancer Other        pat great grandfather  . Heart attack Paternal Uncle   . Depression Maternal Aunt   . Diabetes Other        cousins   . Hyperlipidemia Other   . Hypertension Paternal Grandmother     Social History   Tobacco Use  . Smoking status: Never Smoker  . Smokeless tobacco: Never Used   Substance Use Topics  . Alcohol use: No  . Drug use: No    Allergies:  Allergies  Allergen Reactions  . Bactrim [Sulfamethoxazole-Trimethoprim] Rash  . Penicillins Rash and Other (See Comments)    Has patient had a PCN reaction causing immediate rash, facial/tongue/throat swelling, SOB or lightheadedness with hypotension: No Has patient had a PCN reaction causing severe rash involving mucus membranes or skin necrosis: No Has patient had a PCN reaction that required hospitalization No Has patient had a PCN reaction occurring within the last 10 years: No If all of the above answers are "NO", then may proceed with Cephalosporin use.  . Sulfa Antibiotics Rash    Medications Prior to Admission  Medication Sig Dispense Refill Last Dose  . docusate sodium (COLACE) 100 MG capsule Take 1 capsule (100 mg total) by mouth 2 (two) times daily as needed. 30 capsule 2   . ibuprofen (ADVIL,MOTRIN) 600 MG tablet Take 1 tablet (600 mg total) by mouth every 6 (six) hours as needed. 60 tablet 3   . oxyCODONE-acetaminophen (PERCOCET/ROXICET) 5-325 MG tablet Take  1 tablet by mouth every 4 (four) hours as needed. 10 tablet 0   . Prenatal Multivit-Min-Fe-FA (PRENATAL VITAMINS PO) Take 1 tablet by mouth daily.    02/05/2018 at Unknown time    Review of Systems  Constitutional: Positive for appetite change and chills. Negative for fever.  Gastrointestinal: Positive for abdominal pain and nausea. Negative for vomiting.  Genitourinary: Positive for vaginal bleeding.   Physical Exam   Blood pressure 120/62, pulse 62, temperature 98 F (36.7 C), temperature source Oral, resp. rate 16, SpO2 99 %, unknown if currently breastfeeding.  Physical Exam  Nursing note and vitals reviewed. Constitutional: She is oriented to person, place, and time. She appears well-developed and well-nourished. No distress.  HENT:  Head: Normocephalic and atraumatic.  Neck: Normal range of motion.  Cardiovascular: Normal rate.   Respiratory: Effort normal. No respiratory distress.  GI: Soft. She exhibits no distension and no mass. There is no abdominal tenderness. There is no rebound and no guarding.  Genitourinary:    Genitourinary Comments: External: no lesions or erythema Vagina: rugated, pink, moist, small bloody discharge in vault Uterus: non enlarged, anteverted, non tender, no CMT Adnexae: no masses, no tenderness left, no tenderness right Cervix closed    Musculoskeletal: Normal range of motion.  Neurological: She is alert and oriented to person, place, and time.  Skin: Skin is warm and dry.  Psychiatric: She has a normal mood and affect.   Results for orders placed or performed during the hospital encounter of 02/09/18 (from the past 24 hour(s))  CBC     Status: Abnormal   Collection Time: 02/09/18  5:10 PM  Result Value Ref Range   WBC 7.5 4.0 - 10.5 K/uL   RBC 4.01 3.87 - 5.11 MIL/uL   Hemoglobin 11.7 (L) 12.0 - 15.0 g/dL   HCT 16.134.9 (L) 09.636.0 - 04.546.0 %   MCV 87.0 80.0 - 100.0 fL   MCH 29.2 26.0 - 34.0 pg   MCHC 33.5 30.0 - 36.0 g/dL   RDW 40.912.4 81.111.5 - 91.415.5 %   Platelets 299 150 - 400 K/uL   nRBC 0.0 0.0 - 0.2 %  Wet prep, genital     Status: Abnormal   Collection Time: 02/09/18  5:27 PM  Result Value Ref Range   Yeast Wet Prep HPF POC NONE SEEN NONE SEEN   Trich, Wet Prep NONE SEEN NONE SEEN   Clue Cells Wet Prep HPF POC NONE SEEN NONE SEEN   WBC, Wet Prep HPF POC FEW (A) NONE SEEN   Sperm NONE SEEN   Urinalysis, Routine w reflex microscopic     Status: Abnormal   Collection Time: 02/09/18  5:30 PM  Result Value Ref Range   Color, Urine YELLOW YELLOW   APPearance CLEAR CLEAR   Specific Gravity, Urine 1.010 1.005 - 1.030   pH 6.0 5.0 - 8.0   Glucose, UA NEGATIVE NEGATIVE mg/dL   Hgb urine dipstick LARGE (A) NEGATIVE   Bilirubin Urine NEGATIVE NEGATIVE   Ketones, ur NEGATIVE NEGATIVE mg/dL   Protein, ur NEGATIVE NEGATIVE mg/dL   Nitrite NEGATIVE NEGATIVE   Leukocytes, UA NEGATIVE  NEGATIVE   RBC / HPF >50 (H) 0 - 5 RBC/hpf   WBC, UA 0-5 0 - 5 WBC/hpf   Bacteria, UA RARE (A) NONE SEEN   Squamous Epithelial / LPF 0-5 0 - 5   Mucus PRESENT    MAU Course  Procedures Orders Placed This Encounter  Procedures  . Wet prep, genital  Standing Status:   Standing    Number of Occurrences:   1    Order Specific Question:   Patient immune status    Answer:   Normal  . CBC    Standing Status:   Standing    Number of Occurrences:   1  . Urinalysis, Routine w reflex microscopic    Standing Status:   Standing    Number of Occurrences:   1  . Orthostatic vital signs    Standing Status:   Standing    Number of Occurrences:   1  . Discharge patient    Order Specific Question:   Discharge disposition    Answer:   01-Home or Self Care [1]    Order Specific Question:   Discharge patient date    Answer:   02/09/2018   Meds ordered this encounter  Medications  . lactated ringers bolus 1,000 mL  . ondansetron (ZOFRAN) 4 MG tablet    Sig: Take 1 tablet (4 mg total) by mouth every 8 (eight) hours as needed for nausea or vomiting.    Dispense:  20 tablet    Refill:  0    Order Specific Question:   Supervising Provider    Answer:   Samara Snide   MDM Labs ordered and reviewed. Not orthostatic. Hgb stable. Bleeding minimal. Low suspicion for retained POCs. Feels better after IVF. Stable for discharge home.   Assessment and Plan   1. History of dilatation and curettage   2. Episode of heavy vaginal bleeding    Discharge home Follow up at [facility] in 2 weeks Return precautions Rx Zofran prn  Allergies as of 02/09/2018      Reactions   Bactrim [sulfamethoxazole-trimethoprim] Rash   Penicillins Rash, Other (See Comments)   Has patient had a PCN reaction causing immediate rash, facial/tongue/throat swelling, SOB or lightheadedness with hypotension: No Has patient had a PCN reaction causing severe rash involving mucus membranes or skin necrosis: No Has patient had  a PCN reaction that required hospitalization No Has patient had a PCN reaction occurring within the last 10 years: No If all of the above answers are "NO", then may proceed with Cephalosporin use.   Sulfa Antibiotics Rash      Medication List    TAKE these medications   docusate sodium 100 MG capsule Commonly known as:  COLACE Take 1 capsule (100 mg total) by mouth 2 (two) times daily as needed.   ibuprofen 600 MG tablet Commonly known as:  ADVIL,MOTRIN Take 1 tablet (600 mg total) by mouth every 6 (six) hours as needed.   ondansetron 4 MG tablet Commonly known as:  ZOFRAN Take 1 tablet (4 mg total) by mouth every 8 (eight) hours as needed for nausea or vomiting.   oxyCODONE-acetaminophen 5-325 MG tablet Commonly known as:  PERCOCET/ROXICET Take 1 tablet by mouth every 4 (four) hours as needed.   PRENATAL VITAMINS PO Take 1 tablet by mouth daily.      Donette Larry, CNM 02/09/2018, 7:52 PM

## 2018-02-09 NOTE — MAU Note (Signed)
Pt had D&C on Tuesday, had been bleeding slightly, bleeding became heavy today, started passing clots.  Feeling very weak & dizzy.  Also having lower abdominal & back pain that became worse today.

## 2018-02-12 LAB — GC/CHLAMYDIA PROBE AMP (~~LOC~~) NOT AT ARMC
Chlamydia: NEGATIVE
Neisseria Gonorrhea: NEGATIVE

## 2018-02-13 ENCOUNTER — Encounter (HOSPITAL_COMMUNITY): Payer: Self-pay

## 2018-02-13 ENCOUNTER — Ambulatory Visit (HOSPITAL_COMMUNITY)
Admission: RE | Admit: 2018-02-13 | Discharge: 2018-02-13 | Disposition: A | Discharge status: Home / Self Care | Payer: Medicaid Other | Source: Ambulatory Visit | Attending: Obstetrics & Gynecology | Admitting: Obstetrics & Gynecology

## 2018-02-13 DIAGNOSIS — N96 Recurrent pregnancy loss: Secondary | ICD-10-CM

## 2018-02-13 DIAGNOSIS — Z3480 Encounter for supervision of other normal pregnancy, unspecified trimester: Secondary | ICD-10-CM | POA: Diagnosis not present

## 2018-02-13 NOTE — Progress Notes (Signed)
 Consultation:  Reason for request: Recurrent pregnancy loss Date of service:02/13/2018 Requesting provider: Kelly Leggett, MD  I met with Ms. Garcon a G5P2 who is here initially for genetic counseling but now for recurrent pregnancy loss. She recently under went a D&C on 02/06/18 for a missed AB at approximately 10 weeks.   Today she is overall doing well with some vaginal bleeding that is decreasing.  With regards to her history she is currently married and has two other children with her husband. Her first two pregnancies completed as term uncomplicated vaginal deliveries. Subsequently, she had a SAB at ~ 10-12 weeks that she recalled having abnormal chromosomes. This pregnancy was noted to be normal but had not obtained genetic screening. She has requested genetic studies to be ran on the recent loss.  She had a prior D&C performed for SAB, she denies endometriosis or adenomyosis. She has no prior personal genetic studies.  Past Medical History:  Diagnosis Date  . 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 GERD (gastroesophageal reflux disease)    occasional , no otc meds  . Missed ab   . Wears glasses    Past Surgical History:  Procedure Laterality Date  . DILATION AND EVACUATION N/A 03/08/2017   Procedure: DILATATION AND EVACUATION with IntraOperative Ultrasound;  Surgeon: Davis, Kelly M, MD;  Location: [facility];  Service: Gynecology;  Laterality: N/A;  . DILATION AND EVACUATION N/A 02/06/2018   Procedure: DILATATION AND EVACUATION;  Surgeon: Constant, Peggy, MD;  Location: [facility];  Service: Gynecology;  Laterality: N/A;  . WISDOM TOOTH EXTRACTION  2007   Social History   Socioeconomic History  . Marital status: Married    Spouse name: Stephen  . Number of children: 1   . Years of education: Not on file  . Highest education level: Not on file  Occupational History  . Occupation: Pharmacy Tech     Employer: [employer]    Comment: [employer]   Social Needs  . Financial resource strain: Not on file  . Food insecurity:    Worry: Not on file    Inability: Not on file  . Transportation needs:    Medical: Not on file    Non-medical: Not on file  Tobacco Use  . Smoking status: Never Smoker  . Smokeless tobacco: Never Used  Substance and Sexual Activity  . Alcohol use: No  . Drug use: No  . Sexual activity: Yes    Partners: Male    Birth control/protection: None  Lifestyle  . Physical activity:    Days per week: Not on file    Minutes per session: Not on file  . Stress: Not on file  Relationships  . Social connections:    Talks on phone: Not on file    Gets together: Not on file    Attends religious service: Not on file    Active member of club or organization: Not on file    Attends meetings of clubs or organizations: Not on file    Relationship status: Not on file  . Intimate partner violence:    Fear of current or ex partner: Not on file    Emotionally abused: Not on file    Physically abused: Not on file    Forced sexual activity: Not on file  Other Topics Concern  . Not on file  Social History Narrative    2 caffiene per day. No regular exercise routine.    OB History    Gravida Para Term Preterm AB Living  4 2 2   2 2  SAB TAB Ectopic Multiple Live Births    2   0 2    # Outcome Date GA Lbr Len/2nd Weight Sex Delivery Anes PTL Lv  4 Term 09/02/15 [redacted]w[redacted]d 02:10 / 00:18 2934 g M Vag-Vacuum EPI  LIV  3 Term 02/15/12 [redacted]w[redacted]d 05:09 / 00:52 2523 g M Vag-Spont None  LIV  2 TAB           1 TAB            Current Outpatient Medications on File Prior to Encounter  Medication Sig Dispense Refill  . docusate sodium (COLACE) 100 MG capsule Take 1 capsule (100 mg total) by mouth 2 (two) times daily as needed. 30 capsule 2  . ibuprofen (ADVIL,MOTRIN) 600 MG tablet Take 1 tablet (600 mg total) by mouth every 6 (six) hours as needed. 60 tablet 3  . Prenatal Multivit-Min-Fe-FA (PRENATAL VITAMINS PO) Take 1 tablet by mouth  daily.     . ondansetron (ZOFRAN) 4 MG tablet Take 1 tablet (4 mg total) by mouth every 8 (eight) hours as needed for nausea or vomiting. (Patient not taking: Reported on 02/13/2018) 20 tablet 0  . oxyCODONE-acetaminophen (PERCOCET/ROXICET) 5-325 MG tablet Take 1 tablet by mouth every 4 (four) hours as needed. (Patient not taking: Reported on 02/13/2018) 10 tablet 0   No current facility-administered medications on file prior to encounter.    Impression/Counseling: I confirmed my understanding of Ms. Procida care. She had prior SAB's with two confirmed term pregnancies (2014 and 2017) without complication.   One prior SAB was noted to have a chromosomal anomaly per Ms. Finnigan recollection. Current, pregnancy loss is under genetic evaluation.   We discussed that she should be encouraged that most will have a successful pregnancy after a loss such as this, repeated losses are rare. In addition, she has had two prior term deliveries.   I explained that a more thorough evaluation should be performed in cases with 3 early pregnancy loss or two confirmed clinical losses. However, if explained if a second loss occurs with chromosome abnormalities s we recommend maternal and paternal evaluation through reproductive endocrinology infertility and genetic counseling.  I discussed that with regards to when she can pursue another pregnancy I suggested that ACOG notes that pregnancy can occur as early as 2 weeks after an early pregnancy loss. However, waiting until the next menstrual cycle will make calculating the due date easier. I spent 40 minutes with >50% in face to face consultation with Ms. Fry and her husband.  All questions answered. 

## 2018-02-13 NOTE — ED Notes (Signed)
Pt in for preconception consult.  BP 124/67, pulse 71, weight 140.4 lb.  Pt in with Dr. Grace Bushy.

## 2018-02-20 ENCOUNTER — Encounter: Payer: Self-pay | Admitting: Certified Nurse Midwife

## 2018-02-20 ENCOUNTER — Ambulatory Visit (INDEPENDENT_AMBULATORY_CARE_PROVIDER_SITE_OTHER): Payer: Medicaid Other | Admitting: Certified Nurse Midwife

## 2018-02-20 VITALS — BP 119/68 | HR 90 | Resp 16 | Ht 60.0 in | Wt 137.0 lb

## 2018-02-20 DIAGNOSIS — F321 Major depressive disorder, single episode, moderate: Secondary | ICD-10-CM | POA: Diagnosis not present

## 2018-02-20 DIAGNOSIS — O021 Missed abortion: Secondary | ICD-10-CM | POA: Diagnosis not present

## 2018-02-20 DIAGNOSIS — O039 Complete or unspecified spontaneous abortion without complication: Secondary | ICD-10-CM | POA: Diagnosis not present

## 2018-02-20 MED ORDER — SERTRALINE HCL 50 MG PO TABS: 50.0000 mg | ORAL_TABLET | Freq: Every day | ORAL | 3 refills | Status: DC

## 2018-02-20 NOTE — Progress Notes (Signed)
hcgScored 16 on [place] scale

## 2018-02-21 LAB — CBC
HCT: 36.4 % (ref 35.0–45.0)
Hemoglobin: 12.5 g/dL (ref 11.7–15.5)
MCH: 29 pg (ref 27.0–33.0)
MCHC: 34.3 g/dL (ref 32.0–36.0)
MCV: 84.5 fL (ref 80.0–100.0)
MPV: 9.8 fL (ref 7.5–12.5)
Platelets: 391 10*3/uL (ref 140–400)
RBC: 4.31 10*6/uL (ref 3.80–5.10)
RDW: 12 % (ref 11.0–15.0)
WBC: 10.4 10*3/uL (ref 3.8–10.8)

## 2018-02-21 LAB — HCG, QUANTITATIVE, PREGNANCY: HCG, Total, QN: 8 m[IU]/mL

## 2018-02-21 NOTE — Progress Notes (Signed)
History:  Ms. Jennifer Ryan is a 33 y.o. [id number] who presents to clinic today for f/u after D&E for missed abortion on 02/06/2018.   The following portions of the patient's history were reviewed and updated as appropriate: allergies, current medications, family history, past medical history, social history, past surgical history and problem list.  Review of Systems:  Review of Systems  Respiratory: Negative.   Cardiovascular: Negative.   Gastrointestinal: Negative.   Neurological: Positive for dizziness and weakness. Negative for headaches.  Psychiatric/Behavioral: Positive for depression. Negative for suicidal ideas. The patient has insomnia.    Objective:  Physical Exam BP 119/68   Pulse 90   Resp 16   Ht 5' (1.524 m)   Wt 137 lb (62.1 kg)   BMI 26.76 kg/m  Physical Exam Cardiovascular:     Rate and Rhythm: Normal rate and regular rhythm.  Pulmonary:     Effort: Pulmonary effort is normal. No respiratory distress.     Breath sounds: Normal breath sounds.  Abdominal:     General: Abdomen is flat. Bowel sounds are normal. There is no distension.     Palpations: Abdomen is soft.  Psychiatric:        Mood and Affect: Mood is depressed. Affect is flat and tearful.        Behavior: Behavior is withdrawn.        Thought Content: Thought content does not include homicidal or suicidal ideation. Thought content does not include suicidal plan.    Assessment & Plan:  1. Current moderate episode of major depressive disorder without prior episode (HCC) - Patient reports hx of depression and eating disorder  - Has not been on medication for 10 years  - Reports since miscarriages has been becoming more depressed. Patient very tearful in room and unable to make eye contact- head dropped.  - Husband reports patient not eating for the past 2-3 days and if she does eat only eats a handful of food once a day  - Patient does not remember medication for was on in past for depression and  disorder - 16 on edinburgh scale today in office  - sertraline (ZOLOFT) 50 MG tablet; Take 1 tablet (50 mg total) by mouth daily.  Dispense: 30 tablet; Refill: 3  2. Missed abortion - Procedure on 02/06/2018 - Patient unable to cope with loss x2 and does not want to discuss procedure or results today- feels better talking about it in a couple of days.  - Cardiolipin antibodies, IgG, IgM, IgA - B-HCG Quant - CBC   Patient to return this week for another assessment on mood and discuss results of labs/procedure.   Sharyon Cable, CNM 02/20/2018

## 2018-02-22 ENCOUNTER — Ambulatory Visit (INDEPENDENT_AMBULATORY_CARE_PROVIDER_SITE_OTHER): Payer: Medicaid Other | Admitting: Obstetrics & Gynecology

## 2018-02-22 ENCOUNTER — Encounter: Payer: Self-pay | Admitting: Obstetrics & Gynecology

## 2018-02-22 VITALS — BP 111/68 | HR 78 | Resp 16 | Ht 60.0 in | Wt 137.0 lb

## 2018-02-22 DIAGNOSIS — F321 Major depressive disorder, single episode, moderate: Secondary | ICD-10-CM | POA: Diagnosis not present

## 2018-02-22 LAB — CARDIOLIPIN ANTIBODIES, IGG, IGM, IGA
Anticardiolipin IgA: <11 [APL'U]
Anticardiolipin IgG: <14 [GPL'U]
Anticardiolipin IgM: <12 [MPL'U]

## 2018-02-23 ENCOUNTER — Telehealth: Payer: Self-pay

## 2018-02-23 ENCOUNTER — Encounter (HOSPITAL_COMMUNITY): Payer: Self-pay | Admitting: Obstetrics and Gynecology

## 2018-02-23 NOTE — Telephone Encounter (Addendum)
Spoke with pt and she is aware of results of normal female on cytology report. Pt states she started the Zoloft last night and has not had any side effects from it as of right now. Pt requested a copy of cytology report from 02/23/2018 and 03/31/17 be mailed to her house.  ----- Message from Lesly Dukes, MD sent at 02/23/2018 10:53 AM EST ----- Regarding: RE: Cytology Report Thanks.  It is normal female.  Can you email her a copy and call her.  Also, ask her if she started her Zoloft.    Thanks! ----- Message ----- From: Kathie Dike, CMA Sent: 02/23/2018  10:48 AM EST To: Lesly Dukes, MD Subject: Cytology Report                                Just letting you know Congetta's cytology report came back. I scanned it in her chart and routed it to you but I just want to make sure you know because Hula had been asking about it.

## 2018-02-24 NOTE — Progress Notes (Signed)
   Subjective:    Patient ID: Jennifer Ryan, female    DOB: 05/04/1985, 33 y.o.   MRN: [id number]  HPI  Pt presents for further discussion of recent miscarriage and depression.  Pt has been depressed since diagnosis in early January and not getting better.  She is tearful, withdrawn, having problems sleeping, and feels depressed.  Pt was prescribed Zoloft by CNM in or office this week and she wanted to further discuss with me before starting.    Pt denies bleeding.  She has occasional cramping in pelvis which is not present today.  Review of Systems  Constitutional: Positive for activity change and fatigue.  HENT: Negative.   Respiratory: Negative.   Cardiovascular: Negative.   Gastrointestinal: Negative.   Genitourinary: Negative for vaginal bleeding and vaginal discharge.  Musculoskeletal: Negative.   Neurological: Negative.   Psychiatric/Behavioral: Positive for agitation, dysphoric mood and sleep disturbance. Negative for confusion, self-injury and suicidal ideas. The patient is nervous/anxious. The patient is not hyperactive.        Objective:   Physical Exam Vitals signs reviewed.  Constitutional:      General: She is not in acute distress.    Appearance: She is well-developed.  HENT:     Head: Normocephalic and atraumatic.  Eyes:     Conjunctiva/sclera: Conjunctivae normal.  Cardiovascular:     Rate and Rhythm: Normal rate.  Pulmonary:     Effort: Pulmonary effort is normal.  Abdominal:     General: Abdomen is flat. There is no distension.     Palpations: Abdomen is soft.     Tenderness: There is no abdominal tenderness. There is no guarding.  Skin:    General: Skin is warm and dry.  Neurological:     Mental Status: She is alert and oriented to person, place, and time.  Psychiatric:     Comments: Dysphoric mood Not making good eye contact Tearful        Assessment & Plan:  33 yo female with MDD after miscarriage.  It has been 3 weeks and symptoms getting  worse. Discussed benefits of antidepressant for her health and future pregnancies. Reviewed in detail MDM consult Reviewed all labs (normal) and pt had NORMAL chromosomes of POC with BOTH miscarriages (results in Epic) Discussed option of consult with Dr. Jeannie FendY.  He does not take her insurance and will have to pay out of pocket.   RTC 6 weeks to se how she is improving.  SI/HI precuations given to her and husband.  Pt not have SI/HI at this time.    25 minutes spent face to face with patient with >50% counseling.

## 2018-02-26 ENCOUNTER — Encounter: Payer: Self-pay | Admitting: *Deleted

## 2018-03-03 ENCOUNTER — Encounter: Payer: Self-pay | Admitting: Emergency Medicine

## 2018-03-03 ENCOUNTER — Emergency Department (INDEPENDENT_AMBULATORY_CARE_PROVIDER_SITE_OTHER)
Admission: EM | Admit: 2018-03-03 | Discharge: 2018-03-03 | Disposition: A | Discharge status: Home / Self Care | Payer: Medicaid Other | Source: Home / Self Care

## 2018-03-03 ENCOUNTER — Other Ambulatory Visit: Payer: Self-pay

## 2018-03-03 DIAGNOSIS — J40 Bronchitis, not specified as acute or chronic: Secondary | ICD-10-CM

## 2018-03-03 MED ORDER — HYDROCODONE-HOMATROPINE 5-1.5 MG/5ML PO SYRP: 5.0000 mL | ORAL_SOLUTION | Freq: Four times a day (QID) | ORAL | 0 refills | Status: DC | PRN

## 2018-03-03 MED ORDER — AZITHROMYCIN 250 MG PO TABS: ORAL_TABLET | ORAL | 0 refills | Status: DC

## 2018-03-03 NOTE — ED Triage Notes (Signed)
Patient has had a cough over past one week; no known fever; her children have also been ill. Patient had recent SAB. No OTCs today.

## 2018-03-03 NOTE — Discharge Instructions (Signed)
You should see improvement in 2-3 days.

## 2018-03-03 NOTE — ED Provider Notes (Signed)
[facility]    CSN: [id number] Arrival date & time: 03/03/18  1318     History   Chief Complaint Chief Complaint  Patient presents with  . Cough    HPI Jennifer Ryan is a 33 y.o. female.   Initial visit for this 33 year old woman. Patient has had a cough over past one week; no known fever; her children have also been ill. Patient had recent SAB. No OTCs today.  Recently started on Zoloft.  No h/o asthma.  Nonsmoker.     Past Medical History:  Diagnosis Date  . 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 GERD (gastroesophageal reflux disease)    occasional , no otc meds  . Missed ab   . Wears glasses     Patient Active Problem List   Diagnosis Date Noted  . Missed abortion   . Hx maternal GBS (group B streptococcus) affected neonate, pregnant 01/24/2015  . Cystic fibrosis gene carrier 07/17/2011    Past Surgical History:  Procedure Laterality Date  . DILATION AND EVACUATION N/A 03/08/2017   Procedure: DILATATION AND EVACUATION with IntraOperative Ultrasound;  Surgeon: Conan Bowens, MD;  Location: [facility];  Service: Gynecology;  Laterality: N/A;  . DILATION AND EVACUATION N/A 02/06/2018   Procedure: DILATATION AND EVACUATION;  Surgeon: Catalina Antigua, MD;  Location: [facility];  Service: Gynecology;  Laterality: N/A;  . WISDOM TOOTH EXTRACTION  2007    OB History    Gravida  4   Para  2   Term  2   Preterm      AB  2   Living  2     SAB  2   TAB  0   Ectopic      Multiple  0   Live Births  2            Home Medications    Prior to Admission medications   Medication Sig Start Date End Date Taking? Authorizing Provider  azithromycin (ZITHROMAX) 250 MG tablet Take 2 tabs PO x 1 dose, then 1 tab PO QD x 4 days 03/03/18   Elvina Sidle, MD  docusate sodium (COLACE) 100 MG capsule Take 1 capsule (100 mg total) by mouth 2 (two) times daily as needed. 02/06/18   Constant, Peggy, MD  HYDROcodone-homatropine (HYDROMET)  5-1.5 MG/5ML syrup Take 5 mLs by mouth every 6 (six) hours as needed for cough. 03/03/18   Elvina Sidle, MD  ibuprofen (ADVIL,MOTRIN) 600 MG tablet Take 1 tablet (600 mg total) by mouth every 6 (six) hours as needed. 02/06/18   Constant, Peggy, MD  Prenatal Multivit-Min-Fe-FA (PRENATAL VITAMINS PO) Take 1 tablet by mouth daily.     [provider]  sertraline (ZOLOFT) 50 MG tablet Take 1 tablet (50 mg total) by mouth daily. 02/20/18   Sharyon Cable, CNM    Family History Family History  Problem Relation Age of Onset  . Prostate cancer Paternal Grandfather   . Hypertension Paternal Grandfather   . Brain cancer Other        pat great grandfather  . Heart attack Paternal Uncle   . Depression Maternal Aunt   . Diabetes Other        cousins   . Hyperlipidemia Other   . Hypertension Paternal Grandmother     Social History Social History   Tobacco Use  . Smoking status: Never Smoker  . Smokeless tobacco: Never Used  Substance Use Topics  .  Alcohol use: No  . Drug use: No     Allergies   Bactrim [sulfamethoxazole-trimethoprim]; Penicillins; and Sulfa antibiotics   Review of Systems Review of Systems   Physical Exam Triage Vital Signs ED Triage Vitals [03/03/18 1334]  Enc Vitals Group     BP 108/68     Pulse Rate 77     Resp 16     Temp 98.3 F (36.8 C)     Temp Source Oral     SpO2 96 %     Weight 130 lb (59 kg)     Height 5' (1.524 m)     Head Circumference      Peak Flow      Pain Score 0     Pain Loc      Pain Edu?      Excl. in GC?    No data found.  Updated Vital Signs BP 108/68 (BP Location: Right Arm)   Pulse 77   Temp 98.3 F (36.8 C) (Oral)   Resp 16   Ht 5' (1.524 m)   Wt 59 kg   SpO2 96%   BMI 25.39 kg/m    Physical Exam Vitals signs and nursing note reviewed.  Constitutional:      Appearance: Normal appearance. She is normal weight.  HENT:     Head: Normocephalic.     Right Ear: Tympanic membrane and external ear  normal.     Left Ear: Tympanic membrane and external ear normal.     Nose: Nose normal.     Mouth/Throat:     Mouth: Mucous membranes are moist.  Eyes:     Conjunctiva/sclera: Conjunctivae normal.  Neck:     Musculoskeletal: Normal range of motion and neck supple.  Cardiovascular:     Rate and Rhythm: Normal rate and regular rhythm.     Heart sounds: Normal heart sounds.  Pulmonary:     Effort: Pulmonary effort is normal.     Breath sounds: Wheezing and rhonchi present.  Musculoskeletal: Normal range of motion.  Skin:    General: Skin is warm.  Neurological:     General: No focal deficit present.     Mental Status: She is alert.  Psychiatric:        Mood and Affect: Mood normal.      UC Treatments / Results  Labs (all labs ordered are listed, but only abnormal results are displayed) Labs Reviewed - No data to display  EKG None  Radiology No results found.  Procedures Procedures (including critical care time)  Medications Ordered in UC Medications - No data to display  Initial Impression / Assessment and Plan / UC Course  I have reviewed the triage vital signs and the nursing notes.  Pertinent labs & imaging results that were available during my care of the patient were reviewed by me and considered in my medical decision making (see chart for details).     Final Clinical Impressions(s) / UC Diagnoses   Final diagnoses:  Bronchitis     Discharge Instructions     You should see improvement in 2-3 days.    ED Prescriptions    Medication Sig Dispense Auth. Provider   azithromycin (ZITHROMAX) 250 MG tablet Take 2 tabs PO x 1 dose, then 1 tab PO QD x 4 days 6 tablet Elvina SidleLauenstein, Jetta Murray, MD   HYDROcodone-homatropine (HYDROMET) 5-1.5 MG/5ML syrup Take 5 mLs by mouth every 6 (six) hours as needed for cough. 60 mL Elvina SidleLauenstein, Meisha Salone, MD  Controlled Substance Prescriptions [place] Controlled Substance Registry consulted? Not Applicable   Elvina Sidle,  MD 03/03/18 [phone]

## 2018-04-02 ENCOUNTER — Other Ambulatory Visit (HOSPITAL_COMMUNITY)
Admission: RE | Admit: 2018-04-02 | Discharge: 2018-04-02 | Disposition: A | Discharge status: Home / Self Care | Payer: Medicaid Other | Source: Ambulatory Visit | Attending: Obstetrics & Gynecology | Admitting: Obstetrics & Gynecology

## 2018-04-02 ENCOUNTER — Ambulatory Visit: Payer: Medicaid Other | Admitting: Obstetrics & Gynecology

## 2018-04-02 ENCOUNTER — Encounter: Payer: Self-pay | Admitting: Obstetrics & Gynecology

## 2018-04-02 VITALS — BP 106/62 | HR 87 | Resp 16 | Ht 60.5 in | Wt 129.0 lb

## 2018-04-02 DIAGNOSIS — F321 Major depressive disorder, single episode, moderate: Secondary | ICD-10-CM

## 2018-04-02 DIAGNOSIS — N898 Other specified noninflammatory disorders of vagina: Secondary | ICD-10-CM | POA: Insufficient documentation

## 2018-04-02 NOTE — Progress Notes (Signed)
GAD 4 PHQ-9  3

## 2018-04-02 NOTE — Progress Notes (Signed)
   Subjective:    Patient ID: Jennifer Ryan, female    DOB: 02/12/1985, 33 y.o.   MRN: [id number]  HPI  Pt feeling better on meds.  Some bad days.  Pt has appt with REI late March.  Moving houses so not trying to conceive.   C/o itching and burning vaginally. Patient is obviously still very anxious about attempting pregnancy again.  Patient is looking forward to her appointment with the reproductive endocrinologist.  He is seeing a partner of Dr. April Manson  Review of Systems  Constitutional: Negative.   Respiratory: Negative.   Cardiovascular: Negative.   Genitourinary: Positive for vaginal discharge and vaginal pain.  Psychiatric/Behavioral: Negative.        Objective:   Physical Exam Vitals signs reviewed.  Constitutional:      General: She is not in acute distress.    Appearance: She is well-developed.  HENT:     Head: Normocephalic and atraumatic.  Eyes:     Conjunctiva/sclera: Conjunctivae normal.  Cardiovascular:     Rate and Rhythm: Normal rate.  Pulmonary:     Effort: Pulmonary effort is normal.  Genitourinary:    General: Normal vulva.     Vagina: No vaginal discharge.  Skin:    General: Skin is warm and dry.  Neurological:     Mental Status: She is alert and oriented to person, place, and time.    Vitals:   04/02/18 0922  BP: 106/62  Pulse: 87  Resp: 16  Weight: 129 lb (58.5 kg)  Height: 5' 0.5" (1.537 m)        Assessment & Plan:  33 year old female presents for follow-up of anxiety and depression.  Patient doing much better on Zoloft.  She will continue this for the foreseeable future.  Patient also has appointment with reproductive in for chronology to discuss her 2 first trimester miscarriages.  She has a copy of her labs to take with her. Aptima is sent for vaginal symptoms.  No obvious yeast or BV. Patient to start prenatal vitamins.  Patient also gets very little calcium and there is no calcium in her prenatal vitamin of choice.  Patient  will add 500 mg of calcium orally when she takes her prenatal vitamin.  25 minutes spent face-to-face with patient with greater than 50% counseling.

## 2018-04-03 LAB — CERVICOVAGINAL ANCILLARY ONLY
Bacterial vaginitis: NEGATIVE
Candida vaginitis: NEGATIVE

## 2018-07-25 ENCOUNTER — Other Ambulatory Visit: Payer: Self-pay | Admitting: Certified Nurse Midwife

## 2018-07-25 DIAGNOSIS — F321 Major depressive disorder, single episode, moderate: Secondary | ICD-10-CM

## 2018-12-02 ENCOUNTER — Other Ambulatory Visit: Payer: Self-pay | Admitting: Obstetrics & Gynecology

## 2018-12-02 DIAGNOSIS — F321 Major depressive disorder, single episode, moderate: Secondary | ICD-10-CM

## 2018-12-03 ENCOUNTER — Other Ambulatory Visit: Payer: Self-pay | Admitting: *Deleted

## 2018-12-03 DIAGNOSIS — F321 Major depressive disorder, single episode, moderate: Secondary | ICD-10-CM

## 2018-12-03 MED ORDER — SERTRALINE HCL 50 MG PO TABS: 50.0000 mg | ORAL_TABLET | Freq: Every day | ORAL | 3 refills | Status: DC

## 2018-12-03 NOTE — Telephone Encounter (Signed)
RF request given for Zoloft.  She is due for annual in Feb 2021.

## 2019-04-17 ENCOUNTER — Other Ambulatory Visit: Payer: Self-pay | Admitting: Obstetrics & Gynecology

## 2019-04-17 DIAGNOSIS — F321 Major depressive disorder, single episode, moderate: Secondary | ICD-10-CM

## 2019-04-24 ENCOUNTER — Encounter: Payer: Self-pay | Admitting: Family Medicine

## 2019-05-01 ENCOUNTER — Other Ambulatory Visit: Payer: Self-pay | Admitting: *Deleted

## 2019-05-01 DIAGNOSIS — F321 Major depressive disorder, single episode, moderate: Secondary | ICD-10-CM

## 2019-05-01 MED ORDER — SERTRALINE HCL 50 MG PO TABS: 50.0000 mg | ORAL_TABLET | Freq: Every day | ORAL | 0 refills | Status: DC

## 2019-05-01 NOTE — Telephone Encounter (Signed)
Pt called stating that she is out of her Zoloft and is requesting a RF.  She Is overdue for appt.  Appt made with Dr Penne Lash and 1 RF given for Zoloft.  Pt is aware that she is only getting 1 RF until her appt.

## 2019-05-20 ENCOUNTER — Encounter: Payer: Self-pay | Admitting: Obstetrics & Gynecology

## 2019-05-20 ENCOUNTER — Ambulatory Visit: Payer: Medicaid Other | Admitting: Obstetrics & Gynecology

## 2019-05-20 ENCOUNTER — Other Ambulatory Visit: Payer: Self-pay

## 2019-05-20 VITALS — BP 138/77 | HR 98 | Temp 98.7°F | Resp 16 | Ht 60.5 in | Wt 120.0 lb

## 2019-05-20 DIAGNOSIS — F329 Major depressive disorder, single episode, unspecified: Secondary | ICD-10-CM | POA: Diagnosis not present

## 2019-05-20 DIAGNOSIS — F419 Anxiety disorder, unspecified: Secondary | ICD-10-CM | POA: Insufficient documentation

## 2019-05-20 DIAGNOSIS — F32A Depression, unspecified: Secondary | ICD-10-CM

## 2019-05-20 DIAGNOSIS — F321 Major depressive disorder, single episode, moderate: Secondary | ICD-10-CM | POA: Diagnosis not present

## 2019-05-20 MED ORDER — SERTRALINE HCL 50 MG PO TABS: 50.0000 mg | ORAL_TABLET | Freq: Every day | ORAL | 0 refills | Status: DC

## 2019-05-20 MED ORDER — METFORMIN HCL 1000 MG PO TABS: 1000.0000 mg | ORAL_TABLET | Freq: Two times a day (BID) | ORAL | 6 refills | Status: DC

## 2019-05-20 NOTE — Progress Notes (Signed)
   Subjective:    Patient ID: Jennifer Ryan, female    DOB: 07-01-1985, 34 y.o.   MRN: [id number]  HPI  Pt presents to discuss continuation of her zoloft and metformin.  Pt saw dr. Jeannie Fend and he placed her on metformin.  They had timed intercourse for several months in the fall and did not conceive.  They are thinking of trying again this summer.  Pt is doing well on Zoloft 50 mg.  She has some sad days but overall well.  Pt also thinking of clomid or femara to help with contraception.    Review of Systems  Constitutional: Negative.   Respiratory: Negative.   Cardiovascular: Negative.   Gastrointestinal: Negative.   Genitourinary: Negative.   Psychiatric/Behavioral: Negative.        Objective:   Physical Exam Vitals reviewed.  Constitutional:      General: She is not in acute distress.    Appearance: She is well-developed.  HENT:     Head: Normocephalic and atraumatic.  Eyes:     Conjunctiva/sclera: Conjunctivae normal.  Cardiovascular:     Rate and Rhythm: Normal rate.  Pulmonary:     Effort: Pulmonary effort is normal.  Skin:    General: Skin is warm and dry.  Neurological:     Mental Status: She is alert and oriented to person, place, and time.    Vitals:   05/20/19 1104  BP: 138/77  Pulse: 98  Resp: 16  Temp: 98.7 F (37.1 C)  Weight: 120 lb (54.4 kg)  Height: 5' 0.5" (1.537 m)      Assessment & Plan:  34 yo female with depression controlled on Zoloft.  1.  Rx Zoloft 2.  Rx metformin 3.  PNV when attempting conception.    15 mins spent face to face with >505 counseling

## 2019-05-20 NOTE — Progress Notes (Signed)
GAd- 2        PhQ9- 0

## 2019-06-13 DIAGNOSIS — H5213 Myopia, bilateral: Secondary | ICD-10-CM | POA: Diagnosis not present

## 2019-06-13 DIAGNOSIS — H52223 Regular astigmatism, bilateral: Secondary | ICD-10-CM | POA: Diagnosis not present

## 2019-06-14 DIAGNOSIS — H5213 Myopia, bilateral: Secondary | ICD-10-CM | POA: Diagnosis not present

## 2019-07-10 DIAGNOSIS — H5213 Myopia, bilateral: Secondary | ICD-10-CM | POA: Diagnosis not present

## 2019-07-22 ENCOUNTER — Other Ambulatory Visit: Payer: Self-pay | Admitting: *Deleted

## 2019-07-22 DIAGNOSIS — F321 Major depressive disorder, single episode, moderate: Secondary | ICD-10-CM

## 2019-07-22 MED ORDER — SERTRALINE HCL 50 MG PO TABS: 50.0000 mg | ORAL_TABLET | Freq: Every day | ORAL | 6 refills | Status: DC

## 2019-09-28 ENCOUNTER — Other Ambulatory Visit: Payer: Self-pay | Admitting: Obstetrics & Gynecology

## 2019-09-28 DIAGNOSIS — F321 Major depressive disorder, single episode, moderate: Secondary | ICD-10-CM

## 2020-03-28 DIAGNOSIS — Z880 Allergy status to penicillin: Secondary | ICD-10-CM | POA: Diagnosis not present

## 2020-03-28 DIAGNOSIS — Z882 Allergy status to sulfonamides status: Secondary | ICD-10-CM | POA: Diagnosis not present

## 2020-03-28 DIAGNOSIS — Z3A01 Less than 8 weeks gestation of pregnancy: Secondary | ICD-10-CM | POA: Diagnosis not present

## 2020-03-28 DIAGNOSIS — O200 Threatened abortion: Secondary | ICD-10-CM | POA: Diagnosis not present

## 2020-03-28 DIAGNOSIS — Z3A08 8 weeks gestation of pregnancy: Secondary | ICD-10-CM | POA: Diagnosis not present

## 2020-03-28 DIAGNOSIS — O26891 Other specified pregnancy related conditions, first trimester: Secondary | ICD-10-CM | POA: Diagnosis not present

## 2020-03-30 ENCOUNTER — Other Ambulatory Visit: Payer: Self-pay | Admitting: *Deleted

## 2020-03-30 DIAGNOSIS — O209 Hemorrhage in early pregnancy, unspecified: Secondary | ICD-10-CM

## 2020-03-30 NOTE — Progress Notes (Signed)
Pt here for F/U BHCG.  Her LMP was 02/17/20.  She started to have vaginal bleeding and went to [facility] ED on Saturday.  An U/S was performed and BHCG.  She was told to have a repeat BHCG today.

## 2020-03-31 LAB — HCG, QUANTITATIVE, PREGNANCY: HCG, Total, QN: 8 m[IU]/mL

## 2020-05-25 ENCOUNTER — Other Ambulatory Visit: Payer: Self-pay | Admitting: *Deleted

## 2020-05-25 DIAGNOSIS — F321 Major depressive disorder, single episode, moderate: Secondary | ICD-10-CM

## 2020-05-25 MED ORDER — METFORMIN HCL 1000 MG PO TABS: 1000.0000 mg | ORAL_TABLET | Freq: Two times a day (BID) | ORAL | 1 refills | Status: DC

## 2020-05-25 MED ORDER — SERTRALINE HCL 50 MG PO TABS: 50.0000 mg | ORAL_TABLET | Freq: Every day | ORAL | 1 refills | Status: DC

## 2020-06-02 ENCOUNTER — Ambulatory Visit (INDEPENDENT_AMBULATORY_CARE_PROVIDER_SITE_OTHER): Payer: Medicaid Other | Admitting: *Deleted

## 2020-06-02 ENCOUNTER — Other Ambulatory Visit: Payer: Self-pay

## 2020-06-02 DIAGNOSIS — N96 Recurrent pregnancy loss: Secondary | ICD-10-CM

## 2020-06-02 NOTE — Progress Notes (Signed)
Pt here for PAI-1Gene lab only per Dr Penne Lash

## 2020-06-03 ENCOUNTER — Ambulatory Visit: Payer: Medicaid Other

## 2020-06-11 LAB — PLASMINOGEN ACTIVATOR INHIBITOR-1 (PAI-1) 4G/5G

## 2020-10-15 ENCOUNTER — Encounter: Payer: Self-pay | Admitting: Family Medicine

## 2020-11-17 DIAGNOSIS — F321 Major depressive disorder, single episode, moderate: Secondary | ICD-10-CM

## 2020-11-17 DIAGNOSIS — N96 Recurrent pregnancy loss: Secondary | ICD-10-CM

## 2020-11-17 MED ORDER — SERTRALINE HCL 50 MG PO TABS: 50.0000 mg | ORAL_TABLET | Freq: Every day | ORAL | 2 refills | Status: DC

## 2020-11-17 MED ORDER — METFORMIN HCL 1000 MG PO TABS: 1000.0000 mg | ORAL_TABLET | Freq: Two times a day (BID) | ORAL | 2 refills | Status: DC

## 2020-11-17 NOTE — Telephone Encounter (Signed)
Pt given 3 refills of Metformin and Sertraline per Dr.Leggett. Pt will make appt with PCP to manage medications.

## 2020-11-17 NOTE — Telephone Encounter (Signed)
-----   Message from Lesly Dukes, MD sent at 11/17/2020 11:29 AM EDT ----- Regarding: RE: Med Refills Give her 3 months.  She should make appt with [place] or her PCP in that time for more refills. ----- Message ----- From: Kathie Dike, CMA Sent: 11/17/2020  11:21 AM EDT To: Lesly Dukes, MD Subject: Med Refills                                    Hey Dr.Leggett,  Margret is requesting a refill of Metform and Sertraline. She hasn't been seen since April 2021. Do I need to tell her she needs to have PCP manage these?  Just let me know, please.   Thanks

## 2021-02-24 ENCOUNTER — Telehealth: Payer: Self-pay | Admitting: *Deleted

## 2021-02-24 NOTE — Telephone Encounter (Signed)
Patient will be contacted for New OB scheduling after [facility] faxes records.

## 2021-03-08 ENCOUNTER — Ambulatory Visit (INDEPENDENT_AMBULATORY_CARE_PROVIDER_SITE_OTHER): Payer: Medicaid Other | Admitting: Obstetrics & Gynecology

## 2021-03-08 ENCOUNTER — Other Ambulatory Visit: Payer: Self-pay

## 2021-03-08 ENCOUNTER — Other Ambulatory Visit (HOSPITAL_COMMUNITY)
Admission: RE | Admit: 2021-03-08 | Discharge: 2021-03-08 | Disposition: A | Discharge status: Home / Self Care | Payer: Medicaid Other | Source: Ambulatory Visit | Attending: Obstetrics & Gynecology | Admitting: Obstetrics & Gynecology

## 2021-03-08 ENCOUNTER — Encounter: Payer: Self-pay | Admitting: Obstetrics & Gynecology

## 2021-03-08 VITALS — BP 129/77 | HR 106 | Wt 122.0 lb

## 2021-03-08 DIAGNOSIS — E282 Polycystic ovarian syndrome: Secondary | ICD-10-CM

## 2021-03-08 DIAGNOSIS — Z3A08 8 weeks gestation of pregnancy: Secondary | ICD-10-CM

## 2021-03-08 DIAGNOSIS — N96 Recurrent pregnancy loss: Secondary | ICD-10-CM

## 2021-03-08 DIAGNOSIS — F419 Anxiety disorder, unspecified: Secondary | ICD-10-CM

## 2021-03-08 DIAGNOSIS — O0991 Supervision of high risk pregnancy, unspecified, first trimester: Secondary | ICD-10-CM | POA: Diagnosis not present

## 2021-03-08 DIAGNOSIS — F32A Depression, unspecified: Secondary | ICD-10-CM

## 2021-03-08 DIAGNOSIS — O0990 Supervision of high risk pregnancy, unspecified, unspecified trimester: Secondary | ICD-10-CM | POA: Insufficient documentation

## 2021-03-08 DIAGNOSIS — Z3490 Encounter for supervision of normal pregnancy, unspecified, unspecified trimester: Secondary | ICD-10-CM

## 2021-03-08 MED ORDER — PROMETHAZINE HCL 25 MG PO TABS: 25.0000 mg | ORAL_TABLET | Freq: Four times a day (QID) | ORAL | 2 refills | Status: DC | PRN

## 2021-03-08 NOTE — Progress Notes (Signed)
Subjective:    Jennifer Ryan is a [id number] [redacted]w[redacted]d being seen today for her first obstetrical visit.  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 Patient does intend to breast feed. Pregnancy history fully reviewed.  Patient has a history of recurrent miscarriages 1 at 12 weeks, 1 at 10 weeks, and 1 very early prior to heartbeat.  Patient was seen at [facility].  There is been no explanation for her miscarriages.  She is on daily aspirin.  Patient reports nausea and vomiting most of the day; mild constipation that improved with docusate sodium.  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   GERD (gastroesophageal reflux disease)    occasional , no otc meds   Missed ab    Wears glasses    Past Surgical History:  Procedure Laterality Date   DILATION AND EVACUATION N/A 03/08/2017   Procedure: DILATATION AND EVACUATION with IntraOperative Ultrasound;  Surgeon: 05/06/2017, MD;  Location: [facility];  Service: Gynecology;  Laterality: N/A;   DILATION AND EVACUATION N/A 02/06/2018   Procedure: DILATATION AND EVACUATION;  Surgeon: 04/07/2018, MD;  Location: [facility];  Service: Gynecology;  Laterality: N/A;   WISDOM TOOTH EXTRACTION  2007   Family History  Problem Relation Age of Onset   Prostate cancer Paternal Grandfather    Hypertension Paternal Grandfather    Brain cancer Other        pat great  grandfather   Heart attack Paternal Uncle    Depression Maternal Aunt    Diabetes Other        cousins    Hyperlipidemia Other    Hypertension Paternal Grandmother      Exam    Uterus:     Pelvic Exam:    Perineum: No Hemorrhoids   Vulva: normal   Vagina:  normal mucosa, no blood   pH: N/a   Cervix: no bleeding following Pap   Adnexa: no mass, fullness, tenderness   Bony Pelvis: average  System: Breast:  normal appearance, no masses or tenderness   Skin: normal coloration and turgor, no rashes    Neurologic: oriented, normal mood   Extremities: normal strength, tone, and muscle mass   HEENT sclera clear, anicteric, oropharynx clear, no lesions, and neck supple with midline trachea   Mouth/Teeth mucous membranes moist, pharynx normal without lesions and dental hygiene good   Neck supple and no masses   Cardiovascular: regular rate and rhythm   Respiratory:  appears well, vitals normal, no respiratory distress, acyanotic, normal RR, chest clear, no wheezing, crepitations, rhonchi, normal symmetric  air entry   Abdomen: soft, non-tender; bowel sounds normal; no masses,  no organomegaly   Urinary: urethral meatus normal      Assessment:    Pregnancy: [id number] Patient Active Problem List   Diagnosis Date Noted   Depression 05/20/2019   Hx maternal GBS (group B streptococcus) affected neonate, pregnant 01/24/2015   History of miscarriage 01/19/2015   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 Prenatal vitamins. Problem list reviewed and updated  Genetic Screening discussed:  NIPS at 12 weeks and AFP at 16 weeks; pt is positive for CF and husband is negative Ultrasound discussed; fetal survey: requested.  Will be done at 19 weeks with MFM PCO--hgb A1c, CMP; continue metformin Continue ASA Anxiety--continue zoloft, discussed box breathing and mindfulness; Pt uses prayer daily.  Nausea/vomiting--pt will try phenergan History of infant  with GBS sepsis--Rx in labor and no need to do cultures at 36 weeks.    Follow up in 4 weeks.   [name] 03/08/2021

## 2021-03-08 NOTE — Progress Notes (Signed)
Bedside U/S shows single IUP with FHT of 168 BPM and CRL measuring 13.27mm.

## 2021-03-09 LAB — COMPREHENSIVE METABOLIC PANEL WITH GFR
AG Ratio: 1.8 (calc) (ref 1.0–2.5)
ALT: 10 U/L (ref 6–29)
AST: 14 U/L (ref 10–30)
Albumin: 4.6 g/dL (ref 3.6–5.1)
Alkaline phosphatase (APISO): 54 U/L (ref 31–125)
BUN: 8 mg/dL (ref 7–25)
CO2: 23 mmol/L (ref 20–32)
Calcium: 10 mg/dL (ref 8.6–10.2)
Chloride: 105 mmol/L (ref 98–110)
Creat: 0.57 mg/dL (ref 0.50–0.97)
Globulin: 2.6 g/dL (ref 1.9–3.7)
Glucose, Bld: 95 mg/dL (ref 65–139)
Potassium: 3.9 mmol/L (ref 3.5–5.3)
Sodium: 138 mmol/L (ref 135–146)
Total Bilirubin: 0.5 mg/dL (ref 0.2–1.2)
Total Protein: 7.2 g/dL (ref 6.1–8.1)

## 2021-03-09 LAB — OBSTETRIC PANEL
Absolute Monocytes: 387 cells/uL (ref 200–950)
Antibody Screen: NOT DETECTED
Basophils Absolute: 26 cells/uL (ref 0–200)
Basophils Relative: 0.3 %
Eosinophils Absolute: 52 cells/uL (ref 15–500)
Eosinophils Relative: 0.6 %
HCT: 39.3 % (ref 35.0–45.0)
Hemoglobin: 12.8 g/dL (ref 11.7–15.5)
Hepatitis B Surface Ag: NONREACTIVE
Lymphs Abs: 929 cells/uL (ref 850–3900)
MCH: 28.2 pg (ref 27.0–33.0)
MCHC: 32.6 g/dL (ref 32.0–36.0)
MCV: 86.6 fL (ref 80.0–100.0)
MPV: 10.4 fL (ref 7.5–12.5)
Monocytes Relative: 4.5 %
Neutro Abs: 7207 cells/uL (ref 1500–7800)
Neutrophils Relative %: 83.8 %
Platelets: 358 10*3/uL (ref 140–400)
RBC: 4.54 10*6/uL (ref 3.80–5.10)
RDW: 12.3 % (ref 11.0–15.0)
RPR Ser Ql: NONREACTIVE
Rubella: 2.7 Index
Total Lymphocyte: 10.8 %
WBC: 8.6 10*3/uL (ref 3.8–10.8)

## 2021-03-09 LAB — CYTOLOGY - PAP
Chlamydia: NEGATIVE
Comment: NEGATIVE
Comment: NEGATIVE
Comment: NORMAL
Diagnosis: NEGATIVE
High risk HPV: NEGATIVE
Neisseria Gonorrhea: NEGATIVE

## 2021-03-09 LAB — HEMOGLOBIN A1C
Hgb A1c MFr Bld: 4.8 %{Hb}
Mean Plasma Glucose: 91 mg/dL
eAG (mmol/L): 5 mmol/L

## 2021-03-09 LAB — HIV ANTIBODY (ROUTINE TESTING W REFLEX): HIV 1&2 Ab, 4th Generation: NONREACTIVE

## 2021-03-09 LAB — HEPATITIS C ANTIBODY
Hepatitis C Ab: NONREACTIVE
SIGNAL TO CUT-OFF: <0.02 (ref <1.00)

## 2021-03-09 LAB — TSH: TSH: 1.91 mIU/L

## 2021-03-11 LAB — CULTURE, OB URINE

## 2021-03-11 LAB — URINE CULTURE, OB REFLEX

## 2021-04-06 ENCOUNTER — Other Ambulatory Visit: Payer: Self-pay

## 2021-04-06 ENCOUNTER — Ambulatory Visit (INDEPENDENT_AMBULATORY_CARE_PROVIDER_SITE_OTHER): Payer: Medicaid Other | Admitting: *Deleted

## 2021-04-06 DIAGNOSIS — Z31430 Encounter of female for testing for genetic disease carrier status for procreative management: Secondary | ICD-10-CM | POA: Diagnosis not present

## 2021-04-06 DIAGNOSIS — O0990 Supervision of high risk pregnancy, unspecified, unspecified trimester: Secondary | ICD-10-CM

## 2021-04-06 DIAGNOSIS — Z3481 Encounter for supervision of other normal pregnancy, first trimester: Secondary | ICD-10-CM | POA: Diagnosis not present

## 2021-04-06 NOTE — Progress Notes (Signed)
Pt her for NIPTS only ?

## 2021-04-13 ENCOUNTER — Encounter: Payer: Self-pay | Admitting: Obstetrics & Gynecology

## 2021-04-13 ENCOUNTER — Ambulatory Visit (INDEPENDENT_AMBULATORY_CARE_PROVIDER_SITE_OTHER): Payer: Medicaid Other | Admitting: Obstetrics and Gynecology

## 2021-04-13 ENCOUNTER — Other Ambulatory Visit: Payer: Self-pay

## 2021-04-13 DIAGNOSIS — Z3A13 13 weeks gestation of pregnancy: Secondary | ICD-10-CM

## 2021-04-13 DIAGNOSIS — O0992 Supervision of high risk pregnancy, unspecified, second trimester: Secondary | ICD-10-CM

## 2021-04-13 DIAGNOSIS — O0990 Supervision of high risk pregnancy, unspecified, unspecified trimester: Secondary | ICD-10-CM

## 2021-04-13 MED ORDER — METOCLOPRAMIDE HCL 10 MG PO TABS: 10.0000 mg | ORAL_TABLET | Freq: Three times a day (TID) | ORAL | 2 refills | Status: DC

## 2021-04-13 MED ORDER — ONDANSETRON HCL 4 MG PO TABS: 4.0000 mg | ORAL_TABLET | Freq: Three times a day (TID) | ORAL | 0 refills | Status: DC | PRN

## 2021-04-13 NOTE — Progress Notes (Signed)
Pt had quick bedside U/S which showed an active baby with FHT of 162 BPM.  Due to pt's history of SAB's Dr Gala Romney had told her she could have the U/S today.  NIPTS are normal and it is a BOY. ?

## 2021-04-13 NOTE — Progress Notes (Signed)
? ?  PRENATAL VISIT NOTE ? ?Subjective:  ?Jennifer Ryan is a 36 y.o. [phone] at [redacted]w[redacted]d being seen today for ongoing prenatal care.  She is currently monitored for the following issues for this low-risk pregnancy and has Cystic fibrosis gene carrier; History of miscarriage; Hx maternal GBS (group B streptococcus) affected neonate, pregnant; Anxiety and depression; and Supervision of high risk pregnancy, antepartum on their problem list. ? ?Patient reports no complaints.  Contractions: Not present. Vag. Bleeding: None.  Movement: Absent. Denies leaking of fluid.  ? ?The following portions of the patient's history were reviewed and updated as appropriate: allergies, current medications, past family history, past medical history, past social history, past surgical history and problem list.  ? ?Objective:  ? ?Vitals:  ? 04/13/21 1035  ?BP: 127/70  ?Pulse: (!) 117  ?Weight: 120 lb (54.4 kg)  ? ? ?Fetal Status:     Movement: Absent    ? ?General:  Alert, oriented and cooperative. Patient is in no acute distress.  ?Skin: Skin is warm and dry. No rash noted.   ?Cardiovascular: Normal heart rate noted  ?Respiratory: Normal respiratory effort, no problems with respiration noted  ?Abdomen: Soft, gravid, appropriate for gestational age.  Pain/Pressure: Absent     ?Pelvic: Cervical exam deferred        ?Extremities: Normal range of motion.  Edema: None  ?Mental Status: Normal mood and affect. Normal behavior. Normal judgment and thought content.  ? ?Assessment and Plan:  ?Pregnancy: [id number] at [redacted]w[redacted]d ?1. Supervision of high risk pregnancy, antepartum ? ?- Anatomy scan ordered ?- Tearful today because of gender- Female. She lost 2 girls to miscarriage. Reassured and validated her  feelings.  ?- continued mild nausea. Rx: Reglan and Zofran. Discussed constipation. ?- AFP next visit.  ?-Continue Basa & metformin (PCO, RX per Dr. Penne Lash) ? ? ?Preterm labor symptoms and general obstetric precautions including but not limited to  vaginal bleeding, contractions, leaking of fluid and fetal movement were reviewed in detail with the patient. ?Please refer to After Visit Summary for other counseling recommendations.  ? ?No follow-ups on file. ? ?Future Appointments  ?Date Time Provider Department Center  ?05/18/2021 10:30 AM Leftwich-Kirby, Wilmer Floor, CNM [facility]  ? ? ?Venia Carbon, NP  ?

## 2021-04-19 ENCOUNTER — Encounter: Payer: Self-pay | Admitting: *Deleted

## 2021-04-19 DIAGNOSIS — O0990 Supervision of high risk pregnancy, unspecified, unspecified trimester: Secondary | ICD-10-CM

## 2021-05-04 ENCOUNTER — Ambulatory Visit (INDEPENDENT_AMBULATORY_CARE_PROVIDER_SITE_OTHER): Payer: Medicaid Other | Admitting: Advanced Practice Midwife

## 2021-05-04 ENCOUNTER — Encounter: Payer: Self-pay | Admitting: Advanced Practice Midwife

## 2021-05-04 VITALS — BP 127/77 | HR 99 | Wt 121.0 lb

## 2021-05-04 DIAGNOSIS — Z3A16 16 weeks gestation of pregnancy: Secondary | ICD-10-CM | POA: Diagnosis not present

## 2021-05-04 DIAGNOSIS — N96 Recurrent pregnancy loss: Secondary | ICD-10-CM | POA: Diagnosis not present

## 2021-05-04 DIAGNOSIS — O0990 Supervision of high risk pregnancy, unspecified, unspecified trimester: Secondary | ICD-10-CM | POA: Diagnosis not present

## 2021-05-04 DIAGNOSIS — O99342 Other mental disorders complicating pregnancy, second trimester: Secondary | ICD-10-CM | POA: Diagnosis not present

## 2021-05-04 DIAGNOSIS — R772 Abnormality of alphafetoprotein: Secondary | ICD-10-CM | POA: Diagnosis not present

## 2021-05-04 DIAGNOSIS — F321 Major depressive disorder, single episode, moderate: Secondary | ICD-10-CM

## 2021-05-04 DIAGNOSIS — O0992 Supervision of high risk pregnancy, unspecified, second trimester: Secondary | ICD-10-CM | POA: Diagnosis not present

## 2021-05-04 DIAGNOSIS — F32A Depression, unspecified: Secondary | ICD-10-CM

## 2021-05-04 DIAGNOSIS — E282 Polycystic ovarian syndrome: Secondary | ICD-10-CM | POA: Insufficient documentation

## 2021-05-04 DIAGNOSIS — F419 Anxiety disorder, unspecified: Secondary | ICD-10-CM

## 2021-05-04 MED ORDER — SERTRALINE HCL 50 MG PO TABS: 50.0000 mg | ORAL_TABLET | Freq: Every day | ORAL | 5 refills | Status: DC

## 2021-05-04 MED ORDER — METFORMIN HCL 1000 MG PO TABS: 1000.0000 mg | ORAL_TABLET | Freq: Two times a day (BID) | ORAL | 2 refills | Status: DC

## 2021-05-04 NOTE — Progress Notes (Addendum)
? ?  PRENATAL VISIT NOTE ? ?Subjective:  ?Jennifer Ryan is a 36 y.o. [id number] at [redacted]w[redacted]d being seen today for ongoing prenatal care.  She is currently monitored for the following issues for this high-risk pregnancy and has Cystic fibrosis gene carrier; History of miscarriage; Hx maternal GBS (group B streptococcus) affected neonate, pregnant; Anxiety and depression; Supervision of high risk pregnancy, antepartum; and PCO (polycystic ovaries) on their problem list. ? ?Patient reports no complaints.  Contractions: Not present. Vag. Bleeding: None.  Movement: Present. Denies leaking of fluid.  ? ?The following portions of the patient's history were reviewed and updated as appropriate: allergies, current medications, past family history, past medical history, past social history, past surgical history and problem list.  ? ?Objective:  ? ?Vitals:  ? 05/04/21 1028  ?BP: 127/77  ?Pulse: 99  ?Weight: 121 lb (54.9 kg)  ? ? ?Fetal Status: Fetal Heart Rate (bpm): 136   Movement: Present    ? ?General:  Alert, oriented and cooperative. Patient is in no acute distress.  ?Skin: Skin is warm and dry. No rash noted.   ?Cardiovascular: Normal heart rate noted  ?Respiratory: Normal respiratory effort, no problems with respiration noted  ?Abdomen: Soft, gravid, appropriate for gestational age.  Pain/Pressure: Absent     ?Pelvic: Cervical exam deferred        ?Extremities: Normal range of motion.  Edema: None  ?Mental Status: Normal mood and affect. Normal behavior. Normal judgment and thought content.  ? ?Assessment and Plan:  ?Pregnancy: [id number] at [redacted]w[redacted]d ?1. Supervision of high risk pregnancy, antepartum ?--Anticipatory guidance about next visits/weeks of pregnancy given.  ? ?2. PCO (polycystic ovaries) ?--Taking Metformin for PCO and hx miscarriage ?--F/U with Dr Gala Romney about when to stop medication ? ?3. Anxiety and depression ?--Renewed Zoloft Rx today at pt request, reports doing well, better as this pregnancy progresses ? ?4.  [redacted] weeks gestation of pregnancy ? ? ?Preterm labor symptoms and general obstetric precautions including but not limited to vaginal bleeding, contractions, leaking of fluid and fetal movement were reviewed in detail with the patient. ?Please refer to After Visit Summary for other counseling recommendations.  ? ?Return in about 4 weeks (around 06/01/2021) for HROB, Any provider. ? ?Future Appointments  ?Date Time Provider [place]  ?05/26/2021  8:45 AM [facility] NURSE [facility]  ?05/26/2021  9:00 AM [facility] US1 [facility]  ?05/31/2021  9:30 AM Gala Romney, Fredderick Phenix, MD [facility]  ? ? ?Fatima Blank, CNM  ?

## 2021-05-04 NOTE — Progress Notes (Signed)
Pt wants refill of Metformin and Zoloft ?

## 2021-05-05 LAB — ALPHA FETOPROTEIN, MATERNAL
AFP MoM: POSITIVE — AB
AFP, Serum: 159.6 ng/mL
Calc'd Gestational Age: 16.1 weeks
Maternal Wt: 120 [lb_av]
Risk for ONTD: 1
Twins-AFP: 1

## 2021-05-05 NOTE — Addendum Note (Signed)
Addended by: Fatima Blank A on: 05/05/2021 10:59 PM ? ? Modules accepted: Orders ? ?

## 2021-05-14 ENCOUNTER — Encounter: Payer: Medicaid Other | Admitting: Women's Health

## 2021-05-18 ENCOUNTER — Encounter: Payer: Medicaid Other | Admitting: Advanced Practice Midwife

## 2021-05-19 ENCOUNTER — Encounter: Payer: Self-pay | Admitting: *Deleted

## 2021-05-19 ENCOUNTER — Ambulatory Visit: Payer: Medicaid Other

## 2021-05-19 ENCOUNTER — Ambulatory Visit: Payer: Medicaid Other | Attending: Obstetrics and Gynecology

## 2021-05-19 ENCOUNTER — Other Ambulatory Visit (HOSPITAL_COMMUNITY): Payer: Self-pay | Admitting: Obstetrics & Gynecology

## 2021-05-19 ENCOUNTER — Telehealth: Payer: Self-pay | Admitting: *Deleted

## 2021-05-19 ENCOUNTER — Telehealth: Payer: Self-pay

## 2021-05-19 ENCOUNTER — Ambulatory Visit: Payer: Medicaid Other | Admitting: *Deleted

## 2021-05-19 ENCOUNTER — Other Ambulatory Visit: Payer: Self-pay | Admitting: Obstetrics & Gynecology

## 2021-05-19 VITALS — BP 121/74 | HR 83

## 2021-05-19 DIAGNOSIS — O021 Missed abortion: Secondary | ICD-10-CM

## 2021-05-19 DIAGNOSIS — O0990 Supervision of high risk pregnancy, unspecified, unspecified trimester: Secondary | ICD-10-CM | POA: Insufficient documentation

## 2021-05-19 DIAGNOSIS — O09522 Supervision of elderly multigravida, second trimester: Secondary | ICD-10-CM | POA: Insufficient documentation

## 2021-05-19 NOTE — Telephone Encounter (Signed)
Called patient, no answer, left voicemail with surgery date, time and preop instructions. ?

## 2021-05-19 NOTE — Telephone Encounter (Signed)
Spoke with Jennifer Ryan via phone and let her know that Dr Gala Romney is working on getting her D and C scheduled.  Once the surgery is scheduled I will place a referral to MFM genetics per DR Gala Romney.  Will notify Jennifer Ryan as soon as I have further instructions. ?

## 2021-05-21 ENCOUNTER — Encounter: Payer: Self-pay | Admitting: Obstetrics & Gynecology

## 2021-05-21 DIAGNOSIS — O021 Missed abortion: Secondary | ICD-10-CM | POA: Diagnosis present

## 2021-05-23 ENCOUNTER — Encounter: Payer: Self-pay | Admitting: Obstetrics & Gynecology

## 2021-05-25 ENCOUNTER — Other Ambulatory Visit: Payer: Self-pay

## 2021-05-25 ENCOUNTER — Encounter (HOSPITAL_COMMUNITY): Payer: Self-pay | Admitting: Obstetrics & Gynecology

## 2021-05-25 ENCOUNTER — Other Ambulatory Visit: Payer: Medicaid Other

## 2021-05-25 ENCOUNTER — Ambulatory Visit: Payer: Medicaid Other

## 2021-05-25 NOTE — Progress Notes (Signed)
Jennifer Ryan denies chest pain or shortness of breath.  Patient denies having any s/s of Covid in her household.  Patient denies any known exposure to Covid.  ? ?PCP is Dr. Gala Romney. ? ? ?I instructed  Jennifer Ryan  to shower with antibacteria soap.  DO not shave. No nail polish, artificial or acrylic nails. Wear clean clothes, brush your teeth. ?Glasses, contact lens,dentures or partials may not be worn in the OR. If you need to wear them, please bring a case for glasses, do not wear contacts or bring a case, the hospital does not have contact cases, dentures or partials will have to be removed , make sure they are clean, we will provide a denture cup to put them in. You will need some one to drive you home and a responsible person over the age of 71 to stay with you for the first 24 hours after surgery.  ?

## 2021-05-26 ENCOUNTER — Ambulatory Visit (HOSPITAL_COMMUNITY): Payer: Medicaid Other | Admitting: Anesthesiology

## 2021-05-26 ENCOUNTER — Encounter (HOSPITAL_COMMUNITY)
Admission: RE | Disposition: A | Discharge status: Home / Self Care | Payer: Self-pay | Source: Home / Self Care | Attending: Obstetrics & Gynecology

## 2021-05-26 ENCOUNTER — Ambulatory Visit (HOSPITAL_COMMUNITY)
Admission: RE | Admit: 2021-05-26 | Discharge: 2021-05-26 | Disposition: A | Discharge status: Home / Self Care | Payer: Medicaid Other | Source: Ambulatory Visit | Attending: Obstetrics & Gynecology | Admitting: Obstetrics & Gynecology

## 2021-05-26 ENCOUNTER — Other Ambulatory Visit (HOSPITAL_COMMUNITY): Payer: Self-pay | Admitting: Obstetrics & Gynecology

## 2021-05-26 ENCOUNTER — Ambulatory Visit (HOSPITAL_COMMUNITY)
Admission: RE | Admit: 2021-05-26 | Discharge: 2021-05-26 | Disposition: A | Discharge status: Home / Self Care | Payer: Medicaid Other | Attending: Obstetrics & Gynecology | Admitting: Obstetrics & Gynecology

## 2021-05-26 ENCOUNTER — Other Ambulatory Visit: Payer: Self-pay

## 2021-05-26 ENCOUNTER — Ambulatory Visit: Payer: Medicaid Other

## 2021-05-26 ENCOUNTER — Ambulatory Visit (HOSPITAL_BASED_OUTPATIENT_CLINIC_OR_DEPARTMENT_OTHER): Payer: Medicaid Other | Admitting: Anesthesiology

## 2021-05-26 ENCOUNTER — Other Ambulatory Visit: Payer: Medicaid Other

## 2021-05-26 ENCOUNTER — Encounter (HOSPITAL_COMMUNITY): Payer: Self-pay | Admitting: Obstetrics & Gynecology

## 2021-05-26 DIAGNOSIS — O021 Missed abortion: Secondary | ICD-10-CM | POA: Insufficient documentation

## 2021-05-26 DIAGNOSIS — Z3A14 14 weeks gestation of pregnancy: Secondary | ICD-10-CM | POA: Diagnosis not present

## 2021-05-26 DIAGNOSIS — N96 Recurrent pregnancy loss: Secondary | ICD-10-CM | POA: Diagnosis not present

## 2021-05-26 DIAGNOSIS — F32A Depression, unspecified: Secondary | ICD-10-CM | POA: Insufficient documentation

## 2021-05-26 DIAGNOSIS — O99342 Other mental disorders complicating pregnancy, second trimester: Secondary | ICD-10-CM | POA: Diagnosis not present

## 2021-05-26 HISTORY — PX: DILATION AND EVACUATION: SHX1459

## 2021-05-26 HISTORY — DX: Pneumonia, unspecified organism: J18.9

## 2021-05-26 HISTORY — PX: OPERATIVE ULTRASOUND: SHX5996

## 2021-05-26 HISTORY — DX: Depression, unspecified: F32.A

## 2021-05-26 LAB — CBC
HCT: 41.2 % (ref 36.0–46.0)
Hemoglobin: 13.3 g/dL (ref 12.0–15.0)
MCH: 29.9 pg (ref 26.0–34.0)
MCHC: 32.3 g/dL (ref 30.0–36.0)
MCV: 92.6 fL (ref 80.0–100.0)
Platelets: 297 10*3/uL (ref 150–400)
RBC: 4.45 MIL/uL (ref 3.87–5.11)
RDW: 12.6 % (ref 11.5–15.5)
WBC: 7.3 10*3/uL (ref 4.0–10.5)
nRBC: 0 % (ref 0.0–0.2)

## 2021-05-26 LAB — TYPE AND SCREEN
ABO/RH(D): O POS
Antibody Screen: NEGATIVE

## 2021-05-26 SURGERY — DILATION AND EVACUATION, UTERUS
Anesthesia: General | Site: Vagina

## 2021-05-26 MED ORDER — METHYLERGONOVINE MALEATE 0.2 MG/ML IJ SOLN
INTRAMUSCULAR | Status: DC | PRN
  Administered 2021-05-26: .2 mg via INTRAMUSCULAR

## 2021-05-26 MED ORDER — DOXYCYCLINE HYCLATE 100 MG IV SOLR
200.0000 mg | INTRAVENOUS | Status: AC
  Administered 2021-05-26: 200 mg via INTRAVENOUS
  Filled 2021-05-26 (×3): qty 200

## 2021-05-26 MED ORDER — OXYCODONE HCL 5 MG/5ML PO SOLN: 5.0000 mg | Freq: Once | ORAL | Status: DC | PRN

## 2021-05-26 MED ORDER — FENTANYL CITRATE (PF) 250 MCG/5ML IJ SOLN
INTRAMUSCULAR | Status: AC
  Filled 2021-05-26: qty 5

## 2021-05-26 MED ORDER — LORAZEPAM 2 MG/ML IJ SOLN
1.0000 mg | Freq: Once | INTRAMUSCULAR | Status: AC
  Administered 2021-05-26: 1 mg via INTRAVENOUS

## 2021-05-26 MED ORDER — IBUPROFEN 600 MG PO TABS: 600.0000 mg | ORAL_TABLET | Freq: Four times a day (QID) | ORAL | 2 refills | Status: DC | PRN

## 2021-05-26 MED ORDER — POVIDONE-IODINE 10 % EX SWAB
2.0000 "application " | Freq: Once | CUTANEOUS | Status: AC
  Administered 2021-05-26: 2 via TOPICAL

## 2021-05-26 MED ORDER — CHLORHEXIDINE GLUCONATE 0.12 % MT SOLN
OROMUCOSAL | Status: AC
  Administered 2021-05-26: 15 mL via OROMUCOSAL
  Filled 2021-05-26: qty 15

## 2021-05-26 MED ORDER — FENTANYL CITRATE (PF) 250 MCG/5ML IJ SOLN
INTRAMUSCULAR | Status: DC | PRN
  Administered 2021-05-26: 100 ug via INTRAVENOUS

## 2021-05-26 MED ORDER — DOCUSATE SODIUM 100 MG PO CAPS: 100.0000 mg | ORAL_CAPSULE | Freq: Two times a day (BID) | ORAL | 2 refills | Status: AC | PRN

## 2021-05-26 MED ORDER — ONDANSETRON HCL 4 MG/2ML IJ SOLN: 4.0000 mg | Freq: Once | INTRAMUSCULAR | Status: DC | PRN

## 2021-05-26 MED ORDER — LIDOCAINE 2% (20 MG/ML) 5 ML SYRINGE
INTRAMUSCULAR | Status: DC | PRN
  Administered 2021-05-26: 40 mg via INTRAVENOUS

## 2021-05-26 MED ORDER — DEXAMETHASONE SODIUM PHOSPHATE 10 MG/ML IJ SOLN
INTRAMUSCULAR | Status: DC | PRN
  Administered 2021-05-26: 10 mg via INTRAVENOUS

## 2021-05-26 MED ORDER — LACTATED RINGERS IV SOLN: INTRAVENOUS | Status: DC

## 2021-05-26 MED ORDER — OXYCODONE HCL 5 MG PO TABS: 5.0000 mg | ORAL_TABLET | ORAL | 0 refills | Status: DC | PRN

## 2021-05-26 MED ORDER — ACETAMINOPHEN 500 MG PO TABS
1000.0000 mg | ORAL_TABLET | ORAL | Status: AC
  Administered 2021-05-26: 1000 mg via ORAL
  Filled 2021-05-26: qty 2

## 2021-05-26 MED ORDER — LORAZEPAM 2 MG/ML IJ SOLN
INTRAMUSCULAR | Status: AC
  Filled 2021-05-26: qty 1

## 2021-05-26 MED ORDER — ONDANSETRON HCL 4 MG/2ML IJ SOLN
INTRAMUSCULAR | Status: DC | PRN
  Administered 2021-05-26: 4 mg via INTRAVENOUS

## 2021-05-26 MED ORDER — OXYCODONE HCL 5 MG PO TABS: 5.0000 mg | ORAL_TABLET | Freq: Once | ORAL | Status: DC | PRN

## 2021-05-26 MED ORDER — KETOROLAC TROMETHAMINE 30 MG/ML IJ SOLN
INTRAMUSCULAR | Status: DC | PRN
  Administered 2021-05-26: 30 mg via INTRAVENOUS

## 2021-05-26 MED ORDER — PHENYLEPHRINE HCL-NACL 20-0.9 MG/250ML-% IV SOLN
INTRAVENOUS | Status: DC | PRN
  Administered 2021-05-26: 20 ug/min via INTRAVENOUS

## 2021-05-26 MED ORDER — 0.9 % SODIUM CHLORIDE (POUR BTL) OPTIME
TOPICAL | Status: DC | PRN
  Administered 2021-05-26: 1000 mL

## 2021-05-26 MED ORDER — ORAL CARE MOUTH RINSE: 15.0000 mL | Freq: Once | OROMUCOSAL | Status: AC

## 2021-05-26 MED ORDER — FENTANYL CITRATE (PF) 100 MCG/2ML IJ SOLN
25.0000 ug | INTRAMUSCULAR | Status: DC | PRN
  Administered 2021-05-26: 100 ug via INTRAVENOUS

## 2021-05-26 MED ORDER — SUCCINYLCHOLINE CHLORIDE 200 MG/10ML IV SOSY
PREFILLED_SYRINGE | INTRAVENOUS | Status: DC | PRN
  Administered 2021-05-26: 120 mg via INTRAVENOUS

## 2021-05-26 MED ORDER — CHLORHEXIDINE GLUCONATE 0.12 % MT SOLN: 15.0000 mL | Freq: Once | OROMUCOSAL | Status: AC

## 2021-05-26 MED ORDER — GLYCOPYRROLATE 0.2 MG/ML IJ SOLN
INTRAMUSCULAR | Status: DC | PRN
  Administered 2021-05-26: .2 mg via INTRAVENOUS

## 2021-05-26 MED ORDER — PHENYLEPHRINE 80 MCG/ML (10ML) SYRINGE FOR IV PUSH (FOR BLOOD PRESSURE SUPPORT)
PREFILLED_SYRINGE | INTRAVENOUS | Status: DC | PRN
  Administered 2021-05-26: 160 ug via INTRAVENOUS

## 2021-05-26 MED ORDER — METHYLERGONOVINE MALEATE 0.2 MG/ML IJ SOLN
INTRAMUSCULAR | Status: AC
  Filled 2021-05-26: qty 1

## 2021-05-26 MED ORDER — EPHEDRINE SULFATE-NACL 50-0.9 MG/10ML-% IV SOSY
PREFILLED_SYRINGE | INTRAVENOUS | Status: DC | PRN
  Administered 2021-05-26 (×2): 5 mg via INTRAVENOUS

## 2021-05-26 MED ORDER — MIDAZOLAM HCL 2 MG/2ML IJ SOLN
INTRAMUSCULAR | Status: AC
  Filled 2021-05-26: qty 2

## 2021-05-26 MED ORDER — MIDAZOLAM HCL 2 MG/2ML IJ SOLN
INTRAMUSCULAR | Status: DC | PRN
Start: 2021-05-26 — End: 2021-05-26
  Administered 2021-05-26: 2 mg via INTRAVENOUS

## 2021-05-26 MED ORDER — FENTANYL CITRATE (PF) 100 MCG/2ML IJ SOLN
INTRAMUSCULAR | Status: AC
  Filled 2021-05-26: qty 2

## 2021-05-26 SURGICAL SUPPLY — 24 items
CATH ROBINSON RED A/P 16FR (CATHETERS) ×2 IMPLANT
CURETTE VAC CURVD RIGID 14 1/2 (MISCELLANEOUS) ×1 IMPLANT
DECANTER SPIKE VIAL GLASS SM (MISCELLANEOUS) ×2 IMPLANT
FILTER UTR ASPR ASSEMBLY (MISCELLANEOUS) ×2 IMPLANT
GLOVE BIOGEL PI IND STRL 7.0 (GLOVE) ×1 IMPLANT
GLOVE BIOGEL PI INDICATOR 7.0 (GLOVE) ×1
GLOVE ECLIPSE 7.0 STRL STRAW (GLOVE) ×2 IMPLANT
GOWN STRL REUS W/ TWL LRG LVL3 (GOWN DISPOSABLE) ×2 IMPLANT
GOWN STRL REUS W/TWL LRG LVL3 (GOWN DISPOSABLE) ×2
HOSE CONNECTING 18IN BERKELEY (TUBING) ×2 IMPLANT
KIT BERKELEY 1ST TRI 3/8 NO TR (MISCELLANEOUS) ×1 IMPLANT
KIT BERKELEY 1ST TRIMESTER 3/8 (MISCELLANEOUS) ×1 IMPLANT
NS IRRIG 1000ML POUR BTL (IV SOLUTION) ×2 IMPLANT
PACK VAGINAL MINOR WOMEN LF (CUSTOM PROCEDURE TRAY) ×2 IMPLANT
PAD OB MATERNITY 4.3X12.25 (PERSONAL CARE ITEMS) ×2 IMPLANT
SET BERKELEY SUCTION TUBING (SUCTIONS) ×2 IMPLANT
TOWEL GREEN STERILE FF (TOWEL DISPOSABLE) ×4 IMPLANT
TUBE VACURETTE 2ND TRIMESTER (CANNULA) ×1 IMPLANT
UNDERPAD 30X36 HEAVY ABSORB (UNDERPADS AND DIAPERS) ×2 IMPLANT
VACURETTE 10 RIGID CVD (CANNULA) IMPLANT
VACURETTE 6 ASPIR F TIP BERK (CANNULA) IMPLANT
VACURETTE 7MM CVD STRL WRAP (CANNULA) IMPLANT
VACURETTE 8 RIGID CVD (CANNULA) IMPLANT
VACURETTE 9 RIGID CVD (CANNULA) IMPLANT

## 2021-05-26 NOTE — Anesthesia Procedure Notes (Signed)
Procedure Name: Intubation ?Date/Time: 05/26/2021 1:10 PM ?Performed by: Vonna Drafts, CRNA ?Pre-anesthesia Checklist: Patient identified, Emergency Drugs available, Suction available and Patient being monitored ?Patient Re-evaluated:Patient Re-evaluated prior to induction ?Oxygen Delivery Method: Circle system utilized ?Preoxygenation: Pre-oxygenation with 100% oxygen ?Induction Type: IV induction, Rapid sequence and Cricoid Pressure applied ?Laryngoscope Size: Mac and 3 ?Grade View: Grade I ?Tube type: Oral ?Tube size: 7.0 mm ?Number of attempts: 1 ?Airway Equipment and Method: Stylet and Oral airway ?Placement Confirmation: ETT inserted through vocal cords under direct vision, positive ETCO2 and breath sounds checked- equal and bilateral ?Secured at: 21 cm ?Tube secured with: Tape ?Dental Injury: Teeth and Oropharynx as per pre-operative assessment  ? ? ? ? ?

## 2021-05-26 NOTE — Op Note (Signed)
Benard Halsted Goldie ?PROCEDURE DATE:  05/26/2021 ? ?PREOPERATIVE DIAGNOSIS: Intrauterine fetal demise measuring around [redacted] weeks gestation ?POSTOPERATIVE DIAGNOSIS: The same ?PROCEDURE:  Dilation and Evacuation under ultrasound guidance ?SURGEON:  Dr. Jaynie Collins ? ?INDICATIONS: 36 y.o.  [id number] with intrauterine fetal demise measuring around [redacted] weeks gestation, who desires surgical management.  Risks of surgery were discussed with the patient including but not limited to: bleeding which may require transfusion; infection which may require antibiotics; injury to uterus or surrounding organs; need for additional procedures including laparotomy or laparoscopy; possibility of intrauterine scarring which may impair future fertility; and other postoperative/anesthesia complications. Written informed consent was obtained. ? ?FINDINGS:   Intrauterine fetal demise measuring around [redacted] weeks gestation.  Significant amount of products of conception.  Empty endometrial stripe noted on ultrasound at the end of the procedure.  ? ?ANESTHESIA: General, paracervical block with 30 ml of 0.5% Marcaine ?ESTIMATED BLOOD LOSS:  100 ml. ?SPECIMENS:  Products of conception sent to pathology. Some products also sent for microarray analysis Kizzie Fantasia) ?COMPLICATIONS:  None immediate. ? ?PROCEDURE DETAILS:  The patient received intravenous Doxycycline while in the preoperative area.  She was then taken to the operating room where anesthesia  was administered and was found to be adequate.  After an adequate timeout was performed, she was placed in the dorsal lithotomy position and examined; then prepped and draped in the sterile manner.   Her bladder was catheterized for an unmeasured amount of clear, yellow urine. A vaginal speculum was then placed in the patient's vagina and a single tooth tenaculum was applied to the anterior lip of the cervix.  A paracervical block using 30 ml of 0.5% Marcaine was administered. The cervix was gently dilated  under ultrasound guidance to accommodate a 14 mm suction curette that was gently advanced to the uterine fundus.  The suction device was then activated and curette slowly rotated to clear the uterus of products of conception. There was an empty endometrial stripe noted on the ultrasound at the end of the curettage. Methergine 0.2 mg IM was administered to help prevent bleeding. There was minimal bleeding noted at the end of the procedure, and the tenaculum removed with good hemostasis noted.   All instruments were removed from the patient's vagina.  Sponge and instrument counts were correct times three.    The patient tolerated the procedure well and was taken to the recovery area awake, extubated and in stable condition. ? ?The patient will be discharged to home as per PACU criteria.  Routine postoperative instructions given.  She was prescribed Oxycodone, Ibuprofen and Colace.  She will follow up in the office in 2-3 weeks for postoperative evaluation. ? ? ?Jaynie Collins, MD, FACOG ?Obstetrician Heritage manager, Faculty Practice ?[facility] for [employer], [facility] ? ?

## 2021-05-26 NOTE — Progress Notes (Signed)
GYN Note ? ?I was called by Dr. Macon Large who was contacted by the PACU due to concern for psychiatric issues ? ?When I arrived, her VS were normal and stable except for tachypnea in the 20s; she had received ativan and fentanyl and, per the RN, was looking much improved. The patient was became more responsive to questioning but was still not mentating normally; her husband said she's been like this before and that she did stop her zoloft about a week ago when she found out about the miscarriage and she was very depressed leading up to the d&c today. ? ?Pad with normal vaginal bleeding. Dr. Penne Lash in to see patient and eventually we were able to get her dressed and sitting in a recliner.  ? ?Patient likely with extreme difficulty concerning this fourth miscarriage in a row, but medically looks fine. Hopeful that patient will continue to improve and be okay for d/c to home later this afternoon ? ?Cornelia Copa MD ?Attending ?[facility] [employer] [facility]) ?GYN Consult Phone: [phone] (M-F, [phone]) & [phone] (Off hours, weekends, holidays) ?05/26/2021 ?Time: 1630 ? ?

## 2021-05-26 NOTE — Transfer of Care (Signed)
Immediate Anesthesia Transfer of Care Note ? ?Patient: Jennifer Ryan ? ?Procedure(s) Performed: DILATATION AND EVACUATION (Vagina ) ?OPERATIVE ULTRASOUND (Vagina ) ? ?Patient Location: PACU ? ?Anesthesia Type:General ? ?Level of Consciousness: drowsy ? ?Airway & Oxygen Therapy: Patient Spontanous Breathing ? ?Post-op Assessment: Report given to RN and Post -op Vital signs reviewed and stable ? ?Post vital signs: Reviewed and stable ? ?Last Vitals:  ?Vitals Value Taken Time  ?BP 119/64 05/26/21 1356  ?Temp    ?Pulse 73 05/26/21 1357  ?Resp 14 05/26/21 1357  ?SpO2 100 % 05/26/21 1357  ?Vitals shown include unvalidated device data. ? ?Last Pain:  ?Vitals:  ? 05/26/21 1210  ?TempSrc:   ?PainSc: 0-No pain  ?   ? ?  ? ?Complications: No notable events documented. ?

## 2021-05-26 NOTE — H&P (Signed)
? ? ?Preoperative History and Physical ? ?Jennifer Ryan is a 36 y.o. [id number] here for surgical management of fetal demise at 14 weeks with retention of dead fetus.  History of three previous miscarriages.  Patient desires genetic evaluation of products of conception. No significant preoperative concerns. ? ?Proposed surgery: Dilation and Evacuation under ultrasound guidance ? ?Past Medical History:  ?Diagnosis Date  ? Cystic fibrosis carrier   ? FOB is neg  ? Depression   ? situational  ? GERD (gastroesophageal reflux disease)   ? occasional , no otc meds  ? Hx maternal GBS (group B streptococcus) affected neonate, pregnant 01/24/2015  ? Hx GBS sepsis with previous infant, will need treatment in labor  ? Missed ab   ? Pneumonia   ? Wears glasses   ? ?Past Surgical History:  ?Procedure Laterality Date  ? DILATION AND EVACUATION N/A 03/08/2017  ? Procedure: DILATATION AND EVACUATION with IntraOperative Ultrasound;  Surgeon: Conan Bowensavis, Kelly M, MD;  Location: [facility];  Service: Gynecology;  Laterality: N/A;  ? DILATION AND EVACUATION N/A 02/06/2018  ? Procedure: DILATATION AND EVACUATION;  Surgeon: Catalina Antiguaonstant, Peggy, MD;  Location: [facility];  Service: Gynecology;  Laterality: N/A;  ? WISDOM TOOTH EXTRACTION  2007  ? ?OB History  ?Gravida Para Term Preterm AB Living  ?6 2 2   3 2   ?SAB IAB Ectopic Multiple Live Births  ?3 0   0 2  ?  ?# Outcome Date GA Lbr Len/2nd Weight Sex Delivery Anes PTL Lv  ?6 Current           ?5 Term 09/02/15 6885w3d 02:10 / 00:18 2934 g M Vag-Vacuum EPI  LIV  ?4 Term 02/15/12 6626w6d 05:09 / 00:52 2523 g M Vag-Spont None  LIV  ?3 SAB           ?2 SAB           ?1 SAB           ?Patient denies any other pertinent gynecologic issues.  ? ?No current facility-administered medications on file prior to encounter.  ? ?Current Outpatient Medications on File Prior to Encounter  ?Medication Sig Dispense Refill  ? aspirin 81 MG EC tablet Take 81 mg by mouth every evening.    ? docusate sodium  (COLACE) 100 MG capsule Take 1 capsule (100 mg total) by mouth 2 (two) times daily as needed. 30 capsule 2  ? Prenatal Multivit-Min-Fe-FA (PRENATAL VITAMINS PO) Take 1 tablet by mouth in the morning.    ? sertraline (ZOLOFT) 50 MG tablet Take 1 tablet (50 mg total) by mouth daily. (Patient taking differently: Take 50 mg by mouth at bedtime.) 30 tablet 5  ? ondansetron (ZOFRAN) 4 MG tablet Take 1 tablet (4 mg total) by mouth every 8 (eight) hours as needed for nausea or vomiting. 20 tablet 0  ? promethazine (PHENERGAN) 25 MG tablet Take 1 tablet (25 mg total) by mouth every 6 (six) hours as needed for nausea or vomiting. (Patient not taking: Reported on 05/04/2021) 30 tablet 2  ? ?Allergies  ?Allergen Reactions  ? Penicillins Rash and Other (See Comments)  ?  Has patient had a PCN reaction causing immediate rash, facial/tongue/throat swelling, SOB or lightheadedness with hypotension: No ?Has patient had a PCN reaction causing severe rash involving mucus membranes or skin necrosis: No ?Has patient had a PCN reaction that required hospitalization No ?Has patient had a PCN reaction occurring within the last 10 years: No ?If all of  the above answers are "NO", then may proceed with Cephalosporin use.  ? Sulfa Antibiotics Rash and Other (See Comments)  ? Bactrim [Sulfamethoxazole-Trimethoprim] Rash  ? ? ?Social History:   reports that she has never smoked. She has never used smokeless tobacco. She reports that she does not drink alcohol and does not use drugs. ? ?Family History  ?Problem Relation Age of Onset  ? Prostate cancer Paternal Grandfather   ? Hypertension Paternal Grandfather   ? Brain cancer Other   ?     pat great grandfather  ? Heart attack Paternal Uncle   ? Depression Maternal Aunt   ? Diabetes Other   ?     cousins   ? Hyperlipidemia Other   ? Hypertension Paternal Grandmother   ? ? ?Review of Systems: Pertinent items noted in HPI and remainder of comprehensive ROS otherwise negative. ? ?PHYSICAL EXAM: ?Blood  pressure 114/71, pulse 83, temperature 98.3 ?F (36.8 ?C), temperature source Oral, resp. rate 18, height 5' (1.524 m), weight 54.9 kg, last menstrual period 01/11/2021, SpO2 93 %. ?CONSTITUTIONAL: Well-developed, well-nourished female in no acute distress.  ?HENT:  Normocephalic, atraumatic, External right and left ear normal. Oropharynx is clear and moist ?EYES: Conjunctivae and EOM are normal. Pupils are equal, round, and reactive to light. No scleral icterus.  ?NECK: Normal range of motion, supple, no masses ?SKIN: Skin is warm and dry. No rash noted. Not diaphoretic. No erythema. No pallor. ?NEUROLOGIC: Alert and oriented to person, place, and time. Normal reflexes, muscle tone coordination. No cranial nerve deficit noted. ?PSYCHIATRIC: Normal mood and affect. Normal behavior. Normal judgment and thought content. ?CARDIOVASCULAR: Normal heart rate noted, regular rhythm ?RESPIRATORY: Effort and breath sounds normal, no problems with respiration noted ?ABDOMEN: Soft, nontender, nondistended. ?PELVIC: Deferred ?MUSCULOSKELETAL: Normal range of motion. No edema and no tenderness. 2+ distal pulses. ? ?Labs: ?Results for orders placed or performed during the hospital encounter of 05/26/21 (from the past 336 hour(s))  ?Type and screen  ? Collection Time: 05/26/21 12:02 PM  ?Result Value Ref Range  ? ABO/RH(D) PENDING   ? Antibody Screen PENDING   ? Sample Expiration    ?  05/29/2021,2359 ?Performed at [facility], [address], [place], [place] [address] ?  ?CBC  ? Collection Time: 05/26/21 12:09 PM  ?Result Value Ref Range  ? WBC 7.3 4.0 - 10.5 K/uL  ? RBC 4.45 3.87 - 5.11 MIL/uL  ? Hemoglobin 13.3 12.0 - 15.0 g/dL  ? HCT 41.2 36.0 - 46.0 %  ? MCV 92.6 80.0 - 100.0 fL  ? MCH 29.9 26.0 - 34.0 pg  ? MCHC 32.3 30.0 - 36.0 g/dL  ? RDW 12.6 11.5 - 15.5 %  ? Platelets 297 150 - 400 K/uL  ? nRBC 0.0 0.0 - 0.2 %  ? ? ?Imaging Studies: ?[place] MFM OB DETAIL +14 WK ? ?Result Date:  05/19/2021 ?----------------------------------------------------------------------  OBSTETRICS REPORT                       (Signed Final 05/19/2021 10:04 am) ---------------------------------------------------------------------- Patient Info  ID #:       [id number]                          D.O.B.:  02-25-1985 (36 yrs)  Name:       Jennifer Ryan               Visit Date: 05/19/2021 09:07 am ----------------------------------------------------------------------  Performed By  Attending:        Noralee Space MD        Ref. Address:     [address] [place]  Performed By:     Joanna Hews         [facility]  Referred By:      Everardo All ---------------------------------------------------------------------- Orders  #  Description                           Code        Ordered By  1  [place] MFM OB DETAIL +14 WK               76811.01    [facility] ----------------------------------------------------------------------  #  Order #                     Accession #                Episode #  1  [id number]                   [id number]                 [id number] ---------------------------------------------------------------------- Indications  [redacted] weeks gestation of pregnancy                Z3A.78  Advanced maternal age multigravida 77+,        O29.522  second trimester  Fetal demise less than 22 weeks                O02.1  Antenatal screening for malformations          Z36.3  LR NIPS  AFP position (3.97 MoM)  Genetic carrier (Cystic fibrosis- FOB- Neg)    Z14.8 ---------------------------------------------------------------------- Fetal Evaluation  Num Of Fetuses:         1  Cardiac Activity:       Absent   Presentation:  Breech  Placenta:               Posterior ---------------------------------------------------------------------- Biometry  BPD:      21.4  mm     G. Age:  13w 3d        < 1  %    CI:        60.43   %    70 - 86

## 2021-05-26 NOTE — Anesthesia Postprocedure Evaluation (Signed)
Anesthesia Post Note ? ?Patient: Jennifer Ryan ? ?Procedure(s) Performed: DILATATION AND EVACUATION (Vagina ) ?OPERATIVE ULTRASOUND (Vagina ) ? ?  ? ?Patient location during evaluation: PACU ?Anesthesia Type: General ?Level of consciousness: awake and alert and oriented ?Pain management: pain level controlled ?Vital Signs Assessment: post-procedure vital signs reviewed and stable ?Respiratory status: spontaneous breathing, nonlabored ventilation and respiratory function stable ?Cardiovascular status: blood pressure returned to baseline and stable ?Postop Assessment: no apparent nausea or vomiting ?Anesthetic complications: no ?Comments: Patient had what appeared to be a conversion reaction in PACU. She was hyperventilating, would not respond verbally and was very anxious appearing and was hyperventilating. She was given Ativan 1mg  x 2 and Fentanyl which calmed her down after a while. ? ? ?No notable events documented. ? ?Last Vitals:  ?Vitals:  ? 05/26/21 1530 05/26/21 1630  ?BP:  (!) 108/56  ?Pulse: 68 75  ?Resp: 16 20  ?Temp:  36.5 ?C  ?SpO2: 96% 100%  ?  ?Last Pain:  ?Vitals:  ? 05/26/21 1630  ?TempSrc:   ?PainSc: Asleep  ? ? ?  ?  ?  ?  ?  ?  ? ?Adjoa Althouse A. ? ? ? ? ?

## 2021-05-26 NOTE — Anesthesia Preprocedure Evaluation (Addendum)
Anesthesia Evaluation  ?Patient identified by MRN, date of birth, ID band ?Patient awake ? ? ? ?Reviewed: ?Allergy & Precautions, NPO status , Patient's Chart, lab work & pertinent test results ? ?Airway ?Mallampati: II ? ?TM Distance: >3 FB ?Neck ROM: Full ? ? ? Dental ?no notable dental hx. ?(+) Teeth Intact, Dental Advisory Given ?  ?Pulmonary ?pneumonia, resolved,  ?CF carrier ?  ?Pulmonary exam normal ?breath sounds clear to auscultation ? ? ? ? ? ? Cardiovascular ?negative cardio ROS ?Normal cardiovascular exam ?Rhythm:Regular Rate:Normal ? ? ?  ?Neuro/Psych ?PSYCHIATRIC DISORDERS Anxiety Depression negative neurological ROS ?   ? GI/Hepatic ?Neg liver ROS, GERD  ,  ?Endo/Other  ?PCOS ? Renal/GU ?negative Renal ROS  ?negative genitourinary ?  ?Musculoskeletal ?negative musculoskeletal ROS ?(+)  ? Abdominal ?  ?Peds ? Hematology ?negative hematology ROS ?(+)   ?Anesthesia Other Findings ? ? Reproductive/Obstetrics ?(+) Pregnancy ?Missed Ab 19 weeks ? ?  ? ? ? ? ? ? ? ? ? ? ? ? ? ?  ?  ? ? ? ? ? ? ? ?Anesthesia Physical ?Anesthesia Plan ? ?ASA: 2 ? ?Anesthesia Plan: General  ? ?Post-op Pain Management: Toradol IV (intra-op)*  ? ?Induction: Intravenous, Rapid sequence and Cricoid pressure planned ? ?PONV Risk Score and Plan: 4 or greater and Treatment may vary due to age or medical condition, Midazolam, Ondansetron and Dexamethasone ? ?Airway Management Planned: Oral ETT ? ?Additional Equipment:  ? ?Intra-op Plan:  ? ?Post-operative Plan: Extubation in OR ? ?Informed Consent: I have reviewed the patients History and Physical, chart, labs and discussed the procedure including the risks, benefits and alternatives for the proposed anesthesia with the patient or authorized representative who has indicated his/her understanding and acceptance.  ? ? ? ?Dental advisory given ? ?Plan Discussed with: CRNA and Anesthesiologist ? ?Anesthesia Plan Comments:   ? ? ? ? ? ? ?Anesthesia Quick  Evaluation ? ?

## 2021-05-27 ENCOUNTER — Other Ambulatory Visit: Payer: Self-pay | Admitting: *Deleted

## 2021-05-27 ENCOUNTER — Encounter (HOSPITAL_COMMUNITY): Payer: Self-pay | Admitting: Obstetrics & Gynecology

## 2021-05-27 DIAGNOSIS — N96 Recurrent pregnancy loss: Secondary | ICD-10-CM

## 2021-05-27 LAB — SURGICAL PATHOLOGY

## 2021-05-27 NOTE — Progress Notes (Cosign Needed)
Amb referral placed for MFM medical genetics due to recurrent SAB's and discuss Anora results ?

## 2021-05-31 ENCOUNTER — Encounter: Payer: Self-pay | Admitting: Obstetrics & Gynecology

## 2021-05-31 ENCOUNTER — Encounter: Payer: Medicaid Other | Admitting: Obstetrics & Gynecology

## 2021-06-04 ENCOUNTER — Telehealth: Payer: Self-pay

## 2021-06-04 NOTE — Telephone Encounter (Signed)
Spoke with patient - asked her if we could move her GC appt. To virtual on Tuesday.  Patients spouse works on Tuesday - wants her spouse to be with her. ?

## 2021-06-07 ENCOUNTER — Encounter: Payer: Self-pay | Admitting: Obstetrics & Gynecology

## 2021-06-07 ENCOUNTER — Ambulatory Visit (INDEPENDENT_AMBULATORY_CARE_PROVIDER_SITE_OTHER): Payer: Medicaid Other | Admitting: Obstetrics & Gynecology

## 2021-06-07 VITALS — BP 122/77 | HR 84 | Resp 16 | Ht 60.5 in | Wt 125.0 lb

## 2021-06-07 DIAGNOSIS — N96 Recurrent pregnancy loss: Secondary | ICD-10-CM

## 2021-06-07 NOTE — Progress Notes (Signed)
? ?  Subjective:  ? ? Patient ID: Jennifer Ryan, female    DOB: Oct 14, 1985, 36 y.o.   MRN: [id number] ? ?HPI ? ?Jennifer Ryan and Jennifer Ryan return 2 weeks after a second trimester D&E for IUFD.  Patient was 18 weeks by date but measured approximately 14 weeks.  Patient had a D&E by Dr. Jaynie Collins and had a Arna Medici sent.  The Endor is not back yet.  Patient has not spotting every now and then for the past 2 weeks.  Overall doing better.  She is taking her Zoloft at this time.  She feels stable and feels appropriately sad.  Patient and Jennifer Ryan are very interested in finding the reason for the miscarriage and would like to try again if possible. ? ?Review of Systems  ?Constitutional: Negative.   ?Respiratory: Negative.    ?Cardiovascular: Negative.   ?Gastrointestinal: Negative.   ?Genitourinary: Negative.   ? ?   ?Objective:  ? Physical Exam ?Vitals reviewed.  ?Constitutional:   ?   General: She is not in acute distress. ?   Appearance: She is well-developed.  ?HENT:  ?   Head: Normocephalic and atraumatic.  ?Eyes:  ?   Conjunctiva/sclera: Conjunctivae normal.  ?Cardiovascular:  ?   Rate and Rhythm: Normal rate.  ?Pulmonary:  ?   Effort: Pulmonary effort is normal.  ?Skin: ?   General: Skin is warm and dry.  ?Neurological:  ?   Mental Status: She is alert and oriented to person, place, and time.  ?Psychiatric:     ?   Mood and Affect: Mood normal.  ? ?Vitals:  ? 06/07/21 1043  ?BP: 122/77  ?Pulse: 84  ?Resp: 16  ?Weight: 125 lb (56.7 kg)  ?Height: 5' 0.5" (1.537 m)  ? ?   ?Assessment & Plan:  ?36 year old female status post second trimester D&E ? ?Overall doing well and appropriately sad.  Awaiting a normal results and then will have genetic counseling at the maternal-fetal medicine office. ?Reviewed all of the labs we have done in the past which include TSH, hemoglobin A1c, PAI-1 4G 5G, and antiphospholipid panel.  All of these test have been normal. ? ?35 minutes spent with the patient and her husband during the exam  reviewing the procedure, her medical history, and counseling. ? ?

## 2021-06-11 ENCOUNTER — Encounter: Payer: Self-pay | Admitting: *Deleted

## 2021-06-11 NOTE — Progress Notes (Signed)
Anora results received and pt can now have her genetics consult with MFM. ?

## 2021-06-14 ENCOUNTER — Ambulatory Visit: Payer: Medicaid Other

## 2021-06-14 ENCOUNTER — Encounter: Payer: Self-pay | Admitting: Obstetrics & Gynecology

## 2021-06-21 ENCOUNTER — Ambulatory Visit: Payer: Self-pay | Admitting: Genetics

## 2021-06-21 ENCOUNTER — Ambulatory Visit: Payer: Medicaid Other | Attending: Obstetrics and Gynecology

## 2021-06-21 DIAGNOSIS — Z141 Cystic fibrosis carrier: Secondary | ICD-10-CM | POA: Diagnosis not present

## 2021-06-21 DIAGNOSIS — N96 Recurrent pregnancy loss: Secondary | ICD-10-CM

## 2021-06-21 NOTE — Progress Notes (Unsigned)
Name: Jennifer Ryan Indication: Recurrent pregnancy loss  DOB: 23-Dec-1985 Age: 36 y.o.   EDC: Not currently pregnant LMP: Did not ask Referring Provider:  Duane Lope, NP  EGA: Not currently pregnant Genetic Counselor: Teena Dunk, MS, CGC  OB Hx: [id number] Date of Appointment: 06/21/2021  Accompanied by: Her husband, Jennifer Ryan Face to Face Time: 60 Minutes   Previous Testing Completed: Jennifer Ryan previously completed carrier screening.  Reve screened to be a carrier for Cystic Fibrosis (CF) c.2657+5G>A in the CFTR gene. Jennifer Ryan completed carrier screening for CF and was found to NOT be a carrier. Bellami screened to NOT be a carrier for Spinal Muscular Atrophy (SMA), alpha thalassemia, and beta hemoglobinopathies. A negative result on carrier screening reduces the likelihood of being a carrier, however, does not entirely rule out the possibility.   Pregnancy & Family History:  A three generation pedigree was obtained and is scanned into Epic under the Media tab. Promise's ethnicity is reported to be Caucasian and Jennifer Ryan's ethnicity is reported to be Caucasian. The couple denies consanguinity. They also deny Ashkenazi [place].  Together Jennifer Ryan and Jennifer Ryan have a 30 year old son Jennifer Oliphant) and a 12 year old son Jennifer Branch) that are in good health. Jennifer Ryan has an 104 year old daughter (Jennifer Ryan) who is in good health from a previous partner. Unfortunately, Glada and Jennifer Ryan have had four pregnancy losses: Loss in February of 2019 - Baby Jennifer Ryan found on POC testing to be 46,XX Loss in January of 2020 - Baby Jennifer Ryan found on POC testing to be 46,XX Loss in January of 2022 - Baby Jennifer Ryan (gender unknown; no POC testing given the early gestational age of the loss at [redacted]w[redacted]d) Loss in April of 2023 - Baby Jennifer Ryan found on POC testing to be arr(1-22)x2(XY)x1     Genetic Counseling:   Recurrent Pregnancy Loss. Recurrent Pregnancy Loss (RPL) is defined as three or more  clinically recognized consecutive or non-consecutive pregnancy losses occurring prior to fetal viability (<[redacted] weeks gestation). Pregnancy losses occur for many reasons, including endocrine factors (i.e. uncontrolled diabetes mellitus, luteal phase deficiency, or polycystic ovarian syndrome), environmental agents, immunologic causes (i.e. antiphospholipid syndrome), maternal factors (i.e. uterine anatomic malformations, cervical abnormalities), as well as chromosomal and single-gene disorders. The overall rate of pregnancy loss is approximately 20%, with the majority occurring in the first trimester. After 28 weeks of pregnancy, the incidence is about 3-6% and attributed causes include placental problems (i.e. abruption, infarction), maternal causes, and congenital malformations. Chromosomal causes account for 50-70% of first trimester losses but are less commonly cited as a cause for late gestational pregnancy loss. Most chromosome abnormalities are a result of a new change in the sperm or egg that conceived the fetus; however, in 3-5% of all couples with RPL one member of the couple is found to have a balanced chromosome rearrangement, called a translocation. A translocation occurs when there is an exchange in chromosome material, such as when a segment of one chromosome breaks off and reattaches to a different chromosome. When the translocation is balanced, there is no extra or missing genetic material; however, an individual who carries a balanced translocation has an increased risk to pass unbalanced chromosomes to their offspring. In couples in which one partner is a carrier of a balanced translocation, this can result in an increased risk for infertility, RPL, and offspring with abnormal chromosomes. Genetic counseling discussed with Modesta and Jennifer Ryan that it is unlikely for either of them to carry a balanced  translocation given that all of the genetic testing performed on their pregnancy losses was normal.  We reviewed that we could order chromosome analysis for the couple for peace of mind, however, it would be unlikely for a balanced translocation to be identified. Khady and Jennifer Ryan declined chromosome analysis for themselves at this time given that their losses had normal genetic testing. They plan to explore other causes (non-genetic) for their losses with Sherrina's OB physician.   Carrier screening. Per the ACOG Committee Opinion 691, all women who are considering a future pregnancy or are currently pregnant should be offered carrier screening for, at minimum, Cystic Fibrosis (CF), Spinal Muscular Atrophy (SMA), and Hemoglobinopathies. We discussed that Quina has completed this screening, however, expanded carrier screening (ECS) is another testing option available to her for more information. ECS evaluates for a wider range of genetic conditions. Some of these conditions are severe and actionable, but also rare; others occur more commonly, but are less severe. Testing panels could include more than 400 autosomal recessive or X-linked genetic conditions. In the event that one partner were found to be a carrier for one or more conditions, carrier screening would be available to the other partner for those conditions. Janae and Jennifer Ryan declined ECS at this time.    Thank you for sharing in the care of Jamilah with [place].  Please do not hesitate to contact us if you have any questions.  Teena Dunk, MS, [facility]

## 2021-06-22 ENCOUNTER — Encounter: Payer: Self-pay | Admitting: Obstetrics & Gynecology

## 2021-06-24 ENCOUNTER — Encounter: Payer: Self-pay | Admitting: Obstetrics & Gynecology

## 2021-06-24 ENCOUNTER — Other Ambulatory Visit: Payer: Self-pay | Admitting: Obstetrics & Gynecology

## 2021-06-24 MED ORDER — SERTRALINE HCL 100 MG PO TABS: 100.0000 mg | ORAL_TABLET | Freq: Every day | ORAL | 6 refills | Status: DC

## 2021-06-24 NOTE — Progress Notes (Signed)
Jennifer Ryan feels a little depressed and would like to go up on her zoloft.  100 mg dose sent to [facility].

## 2021-07-05 ENCOUNTER — Telehealth: Payer: Self-pay | Admitting: *Deleted

## 2021-07-05 NOTE — Telephone Encounter (Signed)
Called patient to inquire about what she needed for medical records release form that was received from [facility] Patient stated that she had the records that she needed and access to others.

## 2021-07-18 ENCOUNTER — Other Ambulatory Visit: Payer: Self-pay | Admitting: Obstetrics & Gynecology

## 2021-07-19 ENCOUNTER — Encounter: Payer: Self-pay | Admitting: Obstetrics & Gynecology

## 2021-07-28 ENCOUNTER — Encounter: Payer: Self-pay | Admitting: Obstetrics & Gynecology

## 2021-08-16 ENCOUNTER — Encounter: Payer: Self-pay | Admitting: Obstetrics & Gynecology

## 2021-10-20 ENCOUNTER — Encounter: Payer: Self-pay | Admitting: Obstetrics & Gynecology

## 2021-11-03 ENCOUNTER — Encounter: Payer: Self-pay | Admitting: Obstetrics & Gynecology

## 2021-11-05 ENCOUNTER — Encounter: Payer: Self-pay | Admitting: Obstetrics & Gynecology

## 2021-11-05 ENCOUNTER — Other Ambulatory Visit: Payer: Self-pay | Admitting: Obstetrics & Gynecology

## 2021-11-08 ENCOUNTER — Telehealth: Payer: Self-pay

## 2021-11-08 ENCOUNTER — Encounter: Payer: Self-pay | Admitting: Obstetrics & Gynecology

## 2021-11-08 NOTE — Telephone Encounter (Signed)
Completed PA for pt's Heparin. Reference # [id number]. PA was approved.

## 2021-11-22 ENCOUNTER — Encounter: Payer: Self-pay | Admitting: Obstetrics & Gynecology

## 2021-12-01 DIAGNOSIS — J40 Bronchitis, not specified as acute or chronic: Secondary | ICD-10-CM

## 2021-12-01 HISTORY — DX: Bronchitis, not specified as acute or chronic: J40

## 2021-12-03 DIAGNOSIS — J40 Bronchitis, not specified as acute or chronic: Secondary | ICD-10-CM | POA: Diagnosis not present

## 2021-12-10 ENCOUNTER — Encounter: Payer: Self-pay | Admitting: *Deleted

## 2021-12-13 ENCOUNTER — Ambulatory Visit (INDEPENDENT_AMBULATORY_CARE_PROVIDER_SITE_OTHER): Payer: Medicaid Other

## 2021-12-13 ENCOUNTER — Encounter: Payer: Self-pay | Admitting: Obstetrics & Gynecology

## 2021-12-13 ENCOUNTER — Ambulatory Visit (INDEPENDENT_AMBULATORY_CARE_PROVIDER_SITE_OTHER): Payer: Medicaid Other | Admitting: Obstetrics & Gynecology

## 2021-12-13 ENCOUNTER — Other Ambulatory Visit (HOSPITAL_COMMUNITY)
Admission: RE | Admit: 2021-12-13 | Discharge: 2021-12-13 | Disposition: A | Discharge status: Home / Self Care | Payer: Medicaid Other | Source: Ambulatory Visit | Attending: Obstetrics & Gynecology | Admitting: Obstetrics & Gynecology

## 2021-12-13 VITALS — BP 121/72 | HR 99 | Wt 120.0 lb

## 2021-12-13 DIAGNOSIS — N96 Recurrent pregnancy loss: Secondary | ICD-10-CM | POA: Diagnosis not present

## 2021-12-13 DIAGNOSIS — O0990 Supervision of high risk pregnancy, unspecified, unspecified trimester: Secondary | ICD-10-CM | POA: Insufficient documentation

## 2021-12-13 DIAGNOSIS — O0991 Supervision of high risk pregnancy, unspecified, first trimester: Secondary | ICD-10-CM | POA: Diagnosis not present

## 2021-12-13 DIAGNOSIS — Z3A09 9 weeks gestation of pregnancy: Secondary | ICD-10-CM

## 2021-12-13 DIAGNOSIS — F32A Depression, unspecified: Secondary | ICD-10-CM

## 2021-12-13 DIAGNOSIS — F419 Anxiety disorder, unspecified: Secondary | ICD-10-CM | POA: Diagnosis not present

## 2021-12-13 NOTE — Progress Notes (Signed)
Last pap 2/23

## 2021-12-13 NOTE — Progress Notes (Signed)
Subjective:    Jennifer Ryan is a [id number] [redacted]w[redacted]d being seen today for her first obstetrical visit.  Her obstetrical history is significant for  recurrent miscarriages, 2nd trimester loss, anxiety . Patient does intend to breast feed. Pregnancy history fully reviewed.  Patient reports nausea.  Vitals:   12/13/21 1511  BP: 121/72  Pulse: 99  Weight: 120 lb (54.4 kg)    HISTORY: OB History  Gravida Para Term Preterm AB Living  7 2 2   3 2   SAB IAB Ectopic Multiple Live Births  3 0   0 2    # Outcome Date GA Lbr Len/2nd Weight Sex Delivery Anes PTL Lv  7 Current           6 Term 09/02/15 [redacted]w[redacted]d 02:10 / 00:18 6 lb 7.5 oz (2.934 kg) M Vag-Vacuum EPI  LIV  5 Term 02/15/12 [redacted]w[redacted]d 05:09 / 00:52 5 lb 9 oz (2.523 kg) M Vag-Spont None  LIV  4 SAB           3 Gravida           2 SAB           1 SAB            Past Medical History:  Diagnosis Date   Cystic fibrosis carrier    FOB is neg   Depression    situational   GERD (gastroesophageal reflux disease)    occasional , no otc meds   Hx maternal GBS (group B streptococcus) affected neonate, pregnant 01/24/2015   Hx GBS sepsis with previous infant, will need treatment in labor   Missed ab    Pneumonia    Wears glasses    Past Surgical History:  Procedure Laterality Date   DILATION AND EVACUATION N/A 03/08/2017   Procedure: DILATATION AND EVACUATION with IntraOperative Ultrasound;  Surgeon: 05/06/2017, MD;  Location: [facility];  Service: Gynecology;  Laterality: N/A;   DILATION AND EVACUATION N/A 02/06/2018   Procedure: DILATATION AND EVACUATION;  Surgeon: 04/07/2018, MD;  Location: [facility];  Service: Gynecology;  Laterality: N/A;   DILATION AND EVACUATION N/A 05/26/2021   Procedure: DILATATION AND EVACUATION;  Surgeon: 05/28/2021, MD;  Location: [facility];  Service: Gynecology;  Laterality: N/A;   OPERATIVE ULTRASOUND N/A 05/26/2021   Procedure: OPERATIVE ULTRASOUND;  Surgeon: 05/28/2021, MD;   Location: [facility];  Service: Gynecology;  Laterality: N/A;   WISDOM TOOTH EXTRACTION  2007   Family History  Problem Relation Age of Onset   Prostate cancer Paternal Grandfather    Hypertension Paternal Grandfather    Brain cancer Other        pat great grandfather   Heart attack Paternal Uncle    Depression Maternal Aunt    Diabetes Other        cousins    Hyperlipidemia Other    Hypertension Paternal Grandmother      Exam    Uterus:     Pelvic Exam:    Perineum: No Hemorrhoids   Vulva: normal   Vagina:  normal mucosa   pH: N/a   Cervix: Deferred due to multiple exams this pregnancy   Adnexa: Deferred due to multiple exams this pregnancy   Bony Pelvis: Deferred due to multiple exams this pregnancy  System: Breast:  normal appearance, no masses or tenderness   Skin: normal coloration and turgor, no rashes    Neurologic: oriented, normal mood   Extremities: no deformities  HEENT sclera clear, anicteric, oropharynx clear, no lesions, neck supple with midline trachea, thyroid without masses, and trachea midline   Mouth/Teeth mucous membranes moist, pharynx normal without lesions and dental hygiene good   Neck supple and no masses   Cardiovascular: regular rate and rhythm   Respiratory:  appears well, vitals normal, no respiratory distress, acyanotic, normal RR, neck free of mass or lymphadenopathy, chest clear, no wheezing, crepitations, rhonchi, normal symmetric air entry   Abdomen: soft, non-tender; bowel sounds normal; no masses,  no organomegaly   Urinary: Deferred due to multiple exams this pregnancy      Assessment:    Pregnancy: [id number] Patient Active Problem List   Diagnosis Date Noted   Supervision of high risk pregnancy, antepartum 12/13/2021   PCO (polycystic ovaries) 05/04/2021   Anxiety and depression 05/20/2019   History of recurrent miscarriages 01/19/2015   Cystic fibrosis gene carrier 07/17/2011        Plan:     Initial labs drawn. Prenatal  vitamins. Problem list reviewed and updated. Genetic Screening discussed -- Panorama ordered and AFP at 16 weeks Continue Heparin for recurrent miscarriage--started by [facility], Dr. Jeannie Fend; note sent to dr. Jeannie Fend about switching to Lovenox at 13 -14 weeks.  Continue Zoloft for anxiety and depression CF carrier--husband is negative Baby Rx APP only RTC 3 weeks.   [name] 12/13/2021

## 2021-12-14 ENCOUNTER — Encounter: Payer: Self-pay | Admitting: Obstetrics & Gynecology

## 2021-12-14 LAB — HIV ANTIBODY (ROUTINE TESTING W REFLEX): HIV 1&2 Ab, 4th Generation: NONREACTIVE

## 2021-12-14 LAB — OBSTETRIC PANEL
Absolute Monocytes: 460 cells/uL (ref 200–950)
Antibody Screen: NOT DETECTED
Basophils Absolute: 47 cells/uL (ref 0–200)
Basophils Relative: 0.4 %
Eosinophils Absolute: 94 cells/uL (ref 15–500)
Eosinophils Relative: 0.8 %
HCT: 36.5 % (ref 35.0–45.0)
Hemoglobin: 12 g/dL (ref 11.7–15.5)
Hepatitis B Surface Ag: NONREACTIVE
Lymphs Abs: 1239 cells/uL (ref 850–3900)
MCH: 29.3 pg (ref 27.0–33.0)
MCHC: 32.9 g/dL (ref 32.0–36.0)
MCV: 89.2 fL (ref 80.0–100.0)
MPV: 10.5 fL (ref 7.5–12.5)
Monocytes Relative: 3.9 %
Neutro Abs: 9959 cells/uL — ABNORMAL HIGH (ref 1500–7800)
Neutrophils Relative %: 84.4 %
Platelets: 392 10*3/uL (ref 140–400)
RBC: 4.09 10*6/uL (ref 3.80–5.10)
RDW: 12.9 % (ref 11.0–15.0)
RPR Ser Ql: NONREACTIVE
Rubella: 3.86 Index
Total Lymphocyte: 10.5 %
WBC: 11.8 10*3/uL — ABNORMAL HIGH (ref 3.8–10.8)

## 2021-12-14 LAB — GC/CHLAMYDIA PROBE AMP (~~LOC~~) NOT AT ARMC
Chlamydia: NEGATIVE
Comment: NEGATIVE
Comment: NORMAL
Neisseria Gonorrhea: NEGATIVE

## 2021-12-14 LAB — HEPATITIS C ANTIBODY: Hepatitis C Ab: NONREACTIVE

## 2021-12-15 LAB — CULTURE, OB URINE

## 2021-12-15 LAB — URINE CULTURE, OB REFLEX

## 2021-12-31 DIAGNOSIS — Z3481 Encounter for supervision of other normal pregnancy, first trimester: Secondary | ICD-10-CM | POA: Diagnosis not present

## 2022-01-03 ENCOUNTER — Ambulatory Visit (INDEPENDENT_AMBULATORY_CARE_PROVIDER_SITE_OTHER): Payer: Medicaid Other

## 2022-01-03 ENCOUNTER — Ambulatory Visit (INDEPENDENT_AMBULATORY_CARE_PROVIDER_SITE_OTHER): Payer: Medicaid Other | Admitting: Obstetrics & Gynecology

## 2022-01-03 ENCOUNTER — Telehealth: Payer: Self-pay | Admitting: Obstetrics & Gynecology

## 2022-01-03 VITALS — BP 124/81 | HR 106 | Wt 120.0 lb

## 2022-01-03 DIAGNOSIS — Z3A10 10 weeks gestation of pregnancy: Secondary | ICD-10-CM | POA: Diagnosis not present

## 2022-01-03 DIAGNOSIS — O021 Missed abortion: Secondary | ICD-10-CM | POA: Diagnosis not present

## 2022-01-03 DIAGNOSIS — O3680X0 Pregnancy with inconclusive fetal viability, not applicable or unspecified: Secondary | ICD-10-CM | POA: Diagnosis not present

## 2022-01-03 DIAGNOSIS — Z3A12 12 weeks gestation of pregnancy: Secondary | ICD-10-CM | POA: Diagnosis not present

## 2022-01-03 NOTE — Progress Notes (Signed)
   HIGH-RISK PREGNANCY VISIT Patient name: Jennifer Ryan MRN [id number]  Date of birth: 27-Mar-1985 Chief Complaint:   Routine Prenatal Visit  History of Present Illness:   CHANIE SOUCEK is a 36 y.o. D7O2423 female at [redacted]w[redacted]d with an Estimated Date of Delivery: 07/12/22 being seen today for ongoing management of a high-risk pregnancy.   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 Contractions: Not present. Vag. Bleeding: None.  Movement: Absent. denies leaking of fluid. Review of Systems:   Pertinent items are noted in HPI Denies abnormal vaginal discharge w/ itching/odor/irritation, headaches, visual changes, shortness of breath, chest pain, abdominal pain, severe nausea/vomiting, or problems with urination or bowel movements unless otherwise stated above. Pertinent History Reviewed:  Reviewed past medical,surgical, social, obstetrical and family history.  Reviewed problem list, medications and allergies.  Physical Assessment:   Vitals:   01/03/22 0954  BP: 124/81  Pulse: (!) 106  Weight: 120 lb (54.4 kg)  Body mass index is 23.05 kg/m.        Physical Examination:   General appearance: Well appearing, and in no distress  Mental status: Alert, oriented to person, place, and time  Skin: Warm & dry  Respiratory: Normal respiratory effort, no distress  Abdomen: Soft, gravid, nontender  Pelvic: Cervical exam deferred         Extremities: Edema: None  Psych:  mood and affect appropriate  Fetal Status:      Movement: Absent    Doppler: no heart tones obtained Bedside US; IUP seen, no visible hearttones  No results found for this or any previous visit (from the past 24 hour(s)).   Assessment & Plan:  1) High-risk pregnancy [id number] at [redacted]w[redacted]d with an Estimated Date of Delivery: 07/12/22   -concern for miscarriage -pt sent for official US to confirm findings -briefly discussed management options, she would prefer surgical intervention   Meds: No orders of the defined types were placed in this encounter.   Plan:  next step pending official US results  Follow-up: Return for early OB US.  Orders Placed This Encounter  Procedures   US OB LESS THAN 14 WEEKS WITH OB TRANSVAGINAL    Myna Hidalgo, DO Attending Obstetrician & Gynecologist, [facility] [employer], [facility]

## 2022-01-03 NOTE — Telephone Encounter (Signed)
Tried calling patient x 2 this afternoon to review results of recent US which confirm miscarriage.   Finally reached pt- reviewed [place] report.  Desires to proceed with surgical intervention.   Desires genetic testing

## 2022-01-04 ENCOUNTER — Telehealth: Payer: Self-pay

## 2022-01-04 NOTE — Telephone Encounter (Signed)
Called patient, no answer, left voicemail with surgery date, time, location and preop instructions. 

## 2022-01-05 ENCOUNTER — Other Ambulatory Visit: Payer: Self-pay | Admitting: Obstetrics & Gynecology

## 2022-01-05 ENCOUNTER — Encounter (HOSPITAL_COMMUNITY): Payer: Self-pay | Admitting: Obstetrics & Gynecology

## 2022-01-05 DIAGNOSIS — N96 Recurrent pregnancy loss: Secondary | ICD-10-CM

## 2022-01-05 NOTE — Progress Notes (Signed)
PCP - [place], [place] Location Cardiologist - n/a  Chest x-ray - n/a EKG - n/a Stress Test - n/a ECHO - 03/31/09 CE Cardiac Cath - n/a  ICD Pacemaker/Loop - n/a  Sleep Study -  n/a CPAP - none  Do not take Metformin on the morning of surgery.  Aspirin Instructions: Follow your surgeon's instructions on when to stop aspirin prior to surgery,  If no instructions were given by your surgeon then you will need to call the office for those instructions.  STOP now taking any Aspirin (unless otherwise instructed by your surgeon), Aleve, Naproxen, Ibuprofen, Motrin, Advil, Goody's, BC's, all herbal medications, fish oil, and all vitamins.   Coronavirus Screening Do you have any of the following symptoms:  Cough yes/no: No Fever (>100.66F)  yes/no: No Runny nose yes/no: No Sore throat yes/no: No Difficulty breathing/shortness of breath  yes/no: No  Have you traveled in the last 14 days and where? yes/no: No  Patient verbalized understanding of instructions that were given via phone.

## 2022-01-06 ENCOUNTER — Ambulatory Visit (HOSPITAL_COMMUNITY)
Admission: RE | Admit: 2022-01-06 | Discharge: 2022-01-06 | Disposition: A | Discharge status: Home / Self Care | Payer: Medicaid Other | Attending: Obstetrics & Gynecology | Admitting: Obstetrics & Gynecology

## 2022-01-06 ENCOUNTER — Ambulatory Visit (HOSPITAL_COMMUNITY): Payer: Medicaid Other | Admitting: Anesthesiology

## 2022-01-06 ENCOUNTER — Other Ambulatory Visit: Payer: Self-pay

## 2022-01-06 ENCOUNTER — Encounter (HOSPITAL_COMMUNITY): Payer: Self-pay | Admitting: Obstetrics & Gynecology

## 2022-01-06 ENCOUNTER — Ambulatory Visit (HOSPITAL_BASED_OUTPATIENT_CLINIC_OR_DEPARTMENT_OTHER): Payer: Medicaid Other | Admitting: Anesthesiology

## 2022-01-06 ENCOUNTER — Encounter (HOSPITAL_COMMUNITY)
Admission: RE | Disposition: A | Discharge status: Home / Self Care | Payer: Self-pay | Source: Home / Self Care | Attending: Obstetrics & Gynecology

## 2022-01-06 DIAGNOSIS — F32A Depression, unspecified: Secondary | ICD-10-CM | POA: Diagnosis not present

## 2022-01-06 DIAGNOSIS — F419 Anxiety disorder, unspecified: Secondary | ICD-10-CM | POA: Insufficient documentation

## 2022-01-06 DIAGNOSIS — N96 Recurrent pregnancy loss: Secondary | ICD-10-CM

## 2022-01-06 DIAGNOSIS — O021 Missed abortion: Secondary | ICD-10-CM

## 2022-01-06 DIAGNOSIS — Z3A10 10 weeks gestation of pregnancy: Secondary | ICD-10-CM | POA: Diagnosis not present

## 2022-01-06 DIAGNOSIS — Z3A15 15 weeks gestation of pregnancy: Secondary | ICD-10-CM

## 2022-01-06 HISTORY — PX: DILATION AND EVACUATION: SHX1459

## 2022-01-06 LAB — CBC
HCT: 35.8 % — ABNORMAL LOW (ref 36.0–46.0)
Hemoglobin: 12 g/dL (ref 12.0–15.0)
MCH: 29.9 pg (ref 26.0–34.0)
MCHC: 33.5 g/dL (ref 30.0–36.0)
MCV: 89.1 fL (ref 80.0–100.0)
Platelets: 355 10*3/uL (ref 150–400)
RBC: 4.02 MIL/uL (ref 3.87–5.11)
RDW: 12.9 % (ref 11.5–15.5)
WBC: 6.1 10*3/uL (ref 4.0–10.5)
nRBC: 0 % (ref 0.0–0.2)

## 2022-01-06 LAB — TYPE AND SCREEN
ABO/RH(D): O POS
Antibody Screen: NEGATIVE

## 2022-01-06 SURGERY — DILATION AND EVACUATION, UTERUS
Anesthesia: General

## 2022-01-06 MED ORDER — FENTANYL CITRATE (PF) 250 MCG/5ML IJ SOLN
INTRAMUSCULAR | Status: DC | PRN
  Administered 2022-01-06: 50 ug via INTRAVENOUS

## 2022-01-06 MED ORDER — FENTANYL CITRATE (PF) 250 MCG/5ML IJ SOLN
INTRAMUSCULAR | Status: AC
  Filled 2022-01-06: qty 5

## 2022-01-06 MED ORDER — KETOROLAC TROMETHAMINE 30 MG/ML IJ SOLN
INTRAMUSCULAR | Status: AC
  Filled 2022-01-06: qty 1

## 2022-01-06 MED ORDER — PROMETHAZINE HCL 25 MG/ML IJ SOLN: 6.2500 mg | INTRAMUSCULAR | Status: DC | PRN

## 2022-01-06 MED ORDER — OXYTOCIN BOLUS FROM INFUSION
333.0000 mL | Freq: Once | INTRAVENOUS | Status: AC
  Administered 2022-01-06: 333 mL via INTRAVENOUS

## 2022-01-06 MED ORDER — LACTATED RINGERS IV SOLN: INTRAVENOUS | Status: DC

## 2022-01-06 MED ORDER — OXYCODONE-ACETAMINOPHEN 5-325 MG PO TABS: 1.0000 | ORAL_TABLET | ORAL | 0 refills | Status: AC | PRN

## 2022-01-06 MED ORDER — ORAL CARE MOUTH RINSE: 15.0000 mL | Freq: Once | OROMUCOSAL | Status: AC

## 2022-01-06 MED ORDER — LACTATED RINGERS IV SOLN: INTRAVENOUS | Status: DC | PRN

## 2022-01-06 MED ORDER — ONDANSETRON HCL 4 MG/2ML IJ SOLN
INTRAMUSCULAR | Status: DC | PRN
  Administered 2022-01-06: 4 mg via INTRAVENOUS

## 2022-01-06 MED ORDER — POVIDONE-IODINE 10 % EX SWAB
2.0000 | Freq: Once | CUTANEOUS | Status: AC
  Administered 2022-01-06: 2 via TOPICAL

## 2022-01-06 MED ORDER — OXYTOCIN-SODIUM CHLORIDE 30-0.9 UT/500ML-% IV SOLN
41.6700 mL/h | INTRAVENOUS | Status: DC
  Administered 2022-01-06: 41.67 mL/h via INTRAVENOUS
  Filled 2022-01-06: qty 500

## 2022-01-06 MED ORDER — DEXAMETHASONE SODIUM PHOSPHATE 10 MG/ML IJ SOLN
INTRAMUSCULAR | Status: DC | PRN
  Administered 2022-01-06: 10 mg via INTRAVENOUS

## 2022-01-06 MED ORDER — ACETAMINOPHEN 500 MG PO TABS
1000.0000 mg | ORAL_TABLET | Freq: Once | ORAL | Status: AC
  Administered 2022-01-06: 1000 mg via ORAL
  Filled 2022-01-06: qty 2

## 2022-01-06 MED ORDER — LIDOCAINE 2% (20 MG/ML) 5 ML SYRINGE
INTRAMUSCULAR | Status: AC
  Filled 2022-01-06: qty 5

## 2022-01-06 MED ORDER — DOXYCYCLINE HYCLATE 100 MG IV SOLR
200.0000 mg | INTRAVENOUS | Status: AC
  Administered 2022-01-06: 200 mg via INTRAVENOUS
  Filled 2022-01-06: qty 200

## 2022-01-06 MED ORDER — KETOROLAC TROMETHAMINE 15 MG/ML IJ SOLN
INTRAMUSCULAR | Status: DC | PRN
  Administered 2022-01-06: 30 mg via INTRAVENOUS

## 2022-01-06 MED ORDER — PROPOFOL 10 MG/ML IV BOLUS
INTRAVENOUS | Status: AC
  Filled 2022-01-06: qty 20

## 2022-01-06 MED ORDER — DEXMEDETOMIDINE HCL IN NACL 200 MCG/50ML IV SOLN
INTRAVENOUS | Status: DC | PRN
  Administered 2022-01-06: 10 ug via INTRAVENOUS

## 2022-01-06 MED ORDER — SCOPOLAMINE 1 MG/3DAYS TD PT72
1.0000 | MEDICATED_PATCH | TRANSDERMAL | Status: DC
  Administered 2022-01-06: 1.5 mg via TRANSDERMAL
  Filled 2022-01-06: qty 1

## 2022-01-06 MED ORDER — ONDANSETRON HCL 4 MG/2ML IJ SOLN
INTRAMUSCULAR | Status: AC
  Filled 2022-01-06: qty 2

## 2022-01-06 MED ORDER — FENTANYL CITRATE (PF) 100 MCG/2ML IJ SOLN
INTRAMUSCULAR | Status: AC
  Filled 2022-01-06: qty 2

## 2022-01-06 MED ORDER — DEXMEDETOMIDINE HCL IN NACL 80 MCG/20ML IV SOLN
INTRAVENOUS | Status: AC
  Filled 2022-01-06: qty 20

## 2022-01-06 MED ORDER — MIDAZOLAM HCL 2 MG/2ML IJ SOLN
INTRAMUSCULAR | Status: DC | PRN
  Administered 2022-01-06: 2 mg via INTRAVENOUS

## 2022-01-06 MED ORDER — MIDAZOLAM HCL 2 MG/2ML IJ SOLN
INTRAMUSCULAR | Status: AC
  Filled 2022-01-06: qty 2

## 2022-01-06 MED ORDER — 0.9 % SODIUM CHLORIDE (POUR BTL) OPTIME
TOPICAL | Status: DC | PRN
  Administered 2022-01-06: 1000 mL

## 2022-01-06 MED ORDER — AMISULPRIDE (ANTIEMETIC) 5 MG/2ML IV SOLN: 10.0000 mg | Freq: Once | INTRAVENOUS | Status: DC | PRN

## 2022-01-06 MED ORDER — LIDOCAINE HCL 1 % IJ SOLN
INTRAMUSCULAR | Status: DC | PRN
  Administered 2022-01-06: 10 mL

## 2022-01-06 MED ORDER — CHLORHEXIDINE GLUCONATE 0.12 % MT SOLN
15.0000 mL | Freq: Once | OROMUCOSAL | Status: AC
  Administered 2022-01-06: 15 mL via OROMUCOSAL
  Filled 2022-01-06: qty 15

## 2022-01-06 MED ORDER — DEXAMETHASONE SODIUM PHOSPHATE 10 MG/ML IJ SOLN
INTRAMUSCULAR | Status: AC
  Filled 2022-01-06: qty 1

## 2022-01-06 MED ORDER — FENTANYL CITRATE (PF) 100 MCG/2ML IJ SOLN
25.0000 ug | INTRAMUSCULAR | Status: DC | PRN
  Administered 2022-01-06: 25 ug via INTRAVENOUS

## 2022-01-06 MED ORDER — LIDOCAINE 2% (20 MG/ML) 5 ML SYRINGE
INTRAMUSCULAR | Status: DC | PRN
  Administered 2022-01-06: 80 mg via INTRAVENOUS

## 2022-01-06 MED ORDER — LIDOCAINE HCL (PF) 1 % IJ SOLN
INTRAMUSCULAR | Status: AC
  Filled 2022-01-06: qty 30

## 2022-01-06 MED ORDER — PROPOFOL 10 MG/ML IV BOLUS
INTRAVENOUS | Status: DC | PRN
  Administered 2022-01-06: 200 mg via INTRAVENOUS

## 2022-01-06 SURGICAL SUPPLY — 20 items
CATH ROBINSON RED A/P 16FR (CATHETERS) ×2 IMPLANT
FILTER UTR ASPR ASSEMBLY (MISCELLANEOUS) ×2 IMPLANT
GLOVE BIO SURGEON STRL SZ 6.5 (GLOVE) ×2 IMPLANT
GLOVE BIOGEL PI IND STRL 7.0 (GLOVE) ×4 IMPLANT
GOWN STRL REUS W/ TWL LRG LVL3 (GOWN DISPOSABLE) ×4 IMPLANT
GOWN STRL REUS W/TWL LRG LVL3 (GOWN DISPOSABLE) ×4
HOSE CONNECTING 18IN BERKELEY (TUBING) ×2 IMPLANT
KIT BERKELEY 1ST TRI 3/8 NO TR (MISCELLANEOUS) ×2 IMPLANT
KIT BERKELEY 1ST TRIMESTER 3/8 (MISCELLANEOUS) ×2 IMPLANT
NS IRRIG 1000ML POUR BTL (IV SOLUTION) ×2 IMPLANT
PACK VAGINAL MINOR WOMEN LF (CUSTOM PROCEDURE TRAY) ×2 IMPLANT
PAD OB MATERNITY 4.3X12.25 (PERSONAL CARE ITEMS) ×2 IMPLANT
SET BERKELEY SUCTION TUBING (SUCTIONS) IMPLANT
SPIKE FLUID TRANSFER (MISCELLANEOUS) IMPLANT
TOWEL GREEN STERILE FF (TOWEL DISPOSABLE) ×4 IMPLANT
UNDERPAD 30X36 HEAVY ABSORB (UNDERPADS AND DIAPERS) ×2 IMPLANT
VACURETTE 10 RIGID CVD (CANNULA) IMPLANT
VACURETTE 7MM CVD STRL WRAP (CANNULA) IMPLANT
VACURETTE 8 RIGID CVD (CANNULA) IMPLANT
VACURETTE 9 RIGID CVD (CANNULA) IMPLANT

## 2022-01-06 NOTE — Anesthesia Procedure Notes (Signed)
Procedure Name: LMA Insertion Date/Time: 01/06/2022 10:14 AM  Performed by: Gus Puma, CRNAPre-anesthesia Checklist: Patient identified and Emergency Drugs available Patient Re-evaluated:Patient Re-evaluated prior to induction Oxygen Delivery Method: Circle System Utilized Preoxygenation: Pre-oxygenation with 100% oxygen Induction Type: IV induction Ventilation: Mask ventilation without difficulty LMA: LMA inserted LMA Size: 4.0 Number of attempts: 1 Placement Confirmation: positive ETCO2 and breath sounds checked- equal and bilateral Tube secured with: Tape Dental Injury: Teeth and Oropharynx as per pre-operative assessment

## 2022-01-06 NOTE — Anesthesia Postprocedure Evaluation (Signed)
Anesthesia Post Note  Patient: Jennifer Ryan  Procedure(s) Performed: DILATATION AND EVACUATION CHROMOSOME STUDIES     Patient location during evaluation: PACU Anesthesia Type: General Level of consciousness: sedated Pain management: pain level controlled Vital Signs Assessment: post-procedure vital signs reviewed and stable Respiratory status: spontaneous breathing and respiratory function stable Cardiovascular status: stable Postop Assessment: no apparent nausea or vomiting Anesthetic complications: no  No notable events documented.  Last Vitals:  Vitals:   01/06/22 1145 01/06/22 1200  BP: (!) 96/57 (!) 93/59  Pulse: 61 61  Resp: (!) 26 (!) 23  Temp:    SpO2: 100% 100%    Last Pain:  Vitals:   01/06/22 1200  PainSc: Asleep                 Nemiah Kissner DANIEL

## 2022-01-06 NOTE — Transfer of Care (Signed)
Immediate Anesthesia Transfer of Care Note  Patient: Jennifer Ryan  Procedure(s) Performed: DILATATION AND EVACUATION CHROMOSOME STUDIES  Patient Location: PACU  Anesthesia Type:General  Level of Consciousness: drowsy, patient cooperative, and responds to stimulation  Airway & Oxygen Therapy: Patient Spontanous Breathing  Post-op Assessment: Report given to RN and Post -op Vital signs reviewed and stable  Post vital signs: stable  Last Vitals:  Vitals Value Taken Time  BP 105/72 01/06/22 1054  Temp 36.7 C 01/06/22 1054  Pulse 70 01/06/22 1059  Resp 14 01/06/22 1059  SpO2 100 % 01/06/22 1059  Vitals shown include unvalidated device data.  Last Pain:  Vitals:   01/06/22 0842  PainSc: 0-No pain         Complications: No notable events documented.

## 2022-01-06 NOTE — H&P (Signed)
Jennifer Ryan is an 36 y.o. female. [id number] [redacted]w[redacted]d US showed 10 week fetal demise and D&E is scheduled today  Pertinent Gynecological History:  Previous GYN Procedures: DNC    Menstrual History:  Patient's last menstrual period was 10/05/2021.    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   Depression    situational   GERD (gastroesophageal reflux disease)    occasional , no otc meds   Hx maternal GBS (group B streptococcus) affected neonate, pregnant 01/24/2015   Hx GBS sepsis with previous infant, will need treatment in labor   Missed ab    Pneumonia    Wears glasses     Past Surgical History:  Procedure Laterality Date   DILATION AND EVACUATION N/A 03/08/2017   Procedure: DILATATION AND EVACUATION with IntraOperative Ultrasound;  Surgeon: Conan Bowens, MD;  Location: [facility];  Service: Gynecology;  Laterality: N/A;   DILATION AND EVACUATION N/A 02/06/2018   Procedure: DILATATION AND EVACUATION;  Surgeon: Catalina Antigua, MD;  Location: [facility];  Service: Gynecology;  Laterality: N/A;   DILATION AND EVACUATION N/A 05/26/2021   Procedure: DILATATION AND EVACUATION;  Surgeon: Tereso Newcomer, MD;  Location: [facility];  Service: Gynecology;  Laterality: N/A;   OPERATIVE ULTRASOUND N/A 05/26/2021   Procedure: OPERATIVE ULTRASOUND;  Surgeon: Tereso Newcomer, MD;  Location: [facility];  Service: Gynecology;  Laterality: N/A;   WISDOM TOOTH EXTRACTION  2007    Family History  Problem Relation Age of Onset   Prostate cancer Paternal Grandfather    Hypertension Paternal Grandfather    Brain cancer Other        pat great grandfather   Heart attack Paternal Uncle    Depression Maternal Aunt    Diabetes Other        cousins    Hyperlipidemia Other    Hypertension Paternal Grandmother     Social History:  reports that she has never smoked. She has never used smokeless tobacco. She reports that she does  not drink alcohol and does not use drugs.  Allergies:  Allergies  Allergen Reactions   Penicillins Other (See Comments), Rash and Itching    Has patient had a PCN reaction causing immediate rash, facial/tongue/throat swelling, SOB or lightheadedness with hypotension: No  Has patient had a PCN reaction causing severe rash involving mucus membranes or skin necrosis: No  Has patient had a PCN reaction that required hospitalization No  Has patient had a PCN reaction occurring within the last 10 years: No  If all of the above answers are "NO", then may proceed with Cephalosporin use.  Has patient had a PCN reaction causing immediate rash, facial/tongue/throat swelling, SOB or lightheadedness with hypotension: No Has patient had a PCN reaction causing severe rash involving mucus membranes or skin necrosis: No Has patient had a PCN reaction that required hospitalization No Has patient had a PCN reaction occurring within the last 10 years: No If all of the above answers are "NO", then may proceed with Cephalosporin use.   Sulfa Antibiotics Rash and Other (See Comments)   Bactrim [Sulfamethoxazole-Trimethoprim] Rash    Medications Prior to Admission  Medication Sig Dispense Refill Last Dose   cholecalciferol (VITAMIN D3) 25 MCG (1000 UNIT) tablet Take 1,000 Units by mouth daily.   Past Week   docusate sodium (COLACE) 100 MG capsule Take 1 capsule (100 mg total) by mouth 2 (two) times  daily as needed. 30 capsule 2 Past Month   metFORMIN (GLUCOPHAGE) 1000 MG tablet Take 1,000 mg by mouth 2 (two) times daily with a meal.   Past Week   Prenatal Vit-Fe Fumarate-FA (MULTIVITAMIN-PRENATAL) 27-0.8 MG TABS tablet Take 1 tablet by mouth daily at 12 noon.   Past Week   sertraline (ZOLOFT) 100 MG tablet Take 100 mg by mouth at bedtime.   01/05/2022   aspirin EC 81 MG tablet Take 81 mg by mouth daily. Swallow whole.   01/02/2022   heparin 5000 UNIT/ML injection Inject into the vein.   01/03/2022    Review of  Systems  Constitutional: Negative.   Respiratory: Negative.    Cardiovascular: Negative.   Gastrointestinal: Negative.   Genitourinary: Negative.     Blood pressure (!) 106/58, pulse 68, temperature 98.8 F (37.1 C), resp. rate 16, height 5' (1.524 m), weight 54.4 kg, last menstrual period 10/05/2021, SpO2 100 %. Physical Exam Vitals and nursing note reviewed.  Constitutional:      Appearance: Normal appearance.  HENT:     Head: Normocephalic and atraumatic.  Cardiovascular:     Rate and Rhythm: Normal rate.  Pulmonary:     Effort: Pulmonary effort is normal.  Abdominal:     General: Abdomen is flat.  Musculoskeletal:     Cervical back: Normal range of motion.  Neurological:     Mental Status: She is alert.  Psychiatric:        Mood and Affect: Mood normal.        Behavior: Behavior normal.     Results for orders placed or performed during the hospital encounter of 01/06/22 (from the past 24 hour(s))  CBC     Status: Abnormal   Collection Time: 01/06/22  8:30 AM  Result Value Ref Range   WBC 6.1 4.0 - 10.5 K/uL   RBC 4.02 3.87 - 5.11 MIL/uL   Hemoglobin 12.0 12.0 - 15.0 g/dL   HCT 69.4 (L) 85.4 - 62.7 %   MCV 89.1 80.0 - 100.0 fL   MCH 29.9 26.0 - 34.0 pg   MCHC 33.5 30.0 - 36.0 g/dL   RDW 03.5 00.9 - 38.1 %   Platelets 355 150 - 400 K/uL   nRBC 0.0 0.0 - 0.2 %  Type and screen     Status: None (Preliminary result)   Collection Time: 01/06/22  8:55 AM  Result Value Ref Range   ABO/RH(D) PENDING    Antibody Screen PENDING    Sample Expiration      01/09/2022,2359 Performed at [facility], [address], [place], [place] [address]      Assessment/Plan: Missed abortion for D&C today. The risks of surgery were discussed in detail with the patient including but not limited to: bleeding which may require transfusion or reoperation; infection which may require prolonged hospitalization or re-hospitalization and antibiotic therapy; injury to bowel, bladder,  ureters and major vessels or other surrounding organs which may lead to other procedures; formation of adhesions; need for additional procedures including laparotomy or subsequent procedures secondary to intraoperative injury or abnormal pathology; thromboembolic phenomenon; incisional problems and other postoperative or anesthesia complications.  Patient was told that the likelihood that her condition and symptoms will be treated effectively with this surgical management was very high; the postoperative expectations were also discussed in detail. The patient also understands the alternative treatment options which were discussed in full. All questions were answered.    Scheryl Darter 01/06/2022, 9:29 AM

## 2022-01-06 NOTE — Anesthesia Preprocedure Evaluation (Addendum)
Anesthesia Evaluation  Patient identified by MRN, date of birth, ID band Patient awake    Reviewed: Allergy & Precautions, NPO status , Patient's Chart, lab work & pertinent test results  History of Anesthesia Complications Negative for: history of anesthetic complications  Airway Mallampati: II  TM Distance: >3 FB Neck ROM: Full    Dental no notable dental hx. 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   Pulmonary exam normal        Cardiovascular negative cardio ROS Normal cardiovascular exam     Neuro/Psych  PSYCHIATRIC DISORDERS Anxiety Depression    negative neurological ROS     GI/Hepatic Neg liver ROS,GERD  ,,  Endo/Other  PCOS  Renal/GU negative Renal ROS  negative genitourinary   Musculoskeletal negative musculoskeletal ROS (+)    Abdominal   Peds  Hematology negative hematology ROS (+)   Anesthesia Other Findings   Reproductive/Obstetrics (+) Pregnancy Missed Ab 19 weeks                             Anesthesia Physical Anesthesia Plan  ASA: 2  Anesthesia Plan: General   Post-op Pain Management: Toradol IV (intra-op)* and Tylenol PO (pre-op)*   Induction: Intravenous  PONV Risk Score and Plan: 4 or greater and Treatment may vary due to age or medical condition, Midazolam, Ondansetron, Dexamethasone and Scopolamine patch - Pre-op  Airway Management Planned: LMA  Additional Equipment:   Intra-op Plan:   Post-operative Plan: Extubation in OR  Informed Consent: I have reviewed the patients History and Physical, chart, labs and discussed the procedure including the risks, benefits and alternatives for the proposed anesthesia with the patient or authorized representative who has indicated his/her understanding and acceptance.     Dental advisory given  Plan Discussed with: Anesthesiologist and CRNA  Anesthesia Plan Comments:          Anesthesia Quick Evaluation

## 2022-01-06 NOTE — Op Note (Signed)
Jennifer Ryan PROCEDURE DATE: 01/06/2022  PREOPERATIVE DIAGNOSIS: 10 week missed abortion POSTOPERATIVE DIAGNOSIS: The same PROCEDURE:     Dilation and Evacuation SURGEON:  Scheryl Darter MD  INDICATIONS: 36 y.o. F3O3291 with MAB at [redacted] weeks gestation, needing surgical completion.  Risks of surgery were discussed with the patient including but not limited to: bleeding which may require transfusion; infection which may require antibiotics; injury to uterus or surrounding organs; need for additional procedures including laparotomy or laparoscopy; possibility of intrauterine scarring which may impair future fertility; and other postoperative/anesthesia complications. Written informed consent was obtained.    FINDINGS:  A 10 week size uterus, moderate amounts of products of conception, specimen sent to pathology.  ANESTHESIA:    Monitored intravenous sedation, paracervical block. INTRAVENOUS FLUIDS:  500 ml of LR ESTIMATED BLOOD LOSS:  Less than 20 ml. SPECIMENS:  Products of conception sent to pathology COMPLICATIONS:  None immediate.  PROCEDURE DETAILS:  The patient received intravenous Doxycycline while in the preoperative area.  She was then taken to the operating room where monitored intravenous sedation was administered and was found to be adequate.  After an adequate timeout was performed, she was placed in the dorsal lithotomy position and examined; then prepped and draped in the sterile manner.   Her bladder was catheterized for an unmeasured amount of clear, yellow urine. A vaginal speculum was then placed in the patient's vagina and a single tooth tenaculum was applied to the anterior lip of the cervix.  A paracervical block using 10 ml of 1% lidocaine was administered. The cervix was gently dilated to accommodate a 10 mm suction curette that was gently advanced to the uterine fundus.  The suction device was then activated and curette slowly rotated to clear the uterus of products of  conception. There was minimal bleeding noted and the tenaculum removed with good hemostasis noted.   All instruments were removed from the patient's vagina.  Sponge and instrument counts were correct times two. The patient tolerated the procedure well and was taken to the recovery area awake, and in stable condition.  Adam Phenix, MD 01/06/2022 10:44 AM

## 2022-01-07 ENCOUNTER — Encounter (HOSPITAL_COMMUNITY): Payer: Self-pay | Admitting: Obstetrics & Gynecology

## 2022-01-07 LAB — SURGICAL PATHOLOGY

## 2022-01-09 LAB — PANORAMA PRENATAL TEST FULL PANEL:PANORAMA TEST PLUS 5 ADDITIONAL MICRODELETIONS: FETAL FRACTION: 6.4

## 2022-01-13 ENCOUNTER — Encounter: Payer: Self-pay | Admitting: Obstetrics and Gynecology

## 2022-01-14 LAB — ANORA MISCARRIAGE TEST - FRESH

## 2022-02-02 DIAGNOSIS — H5213 Myopia, bilateral: Secondary | ICD-10-CM | POA: Diagnosis not present

## 2022-03-01 ENCOUNTER — Encounter: Payer: Self-pay | Admitting: Obstetrics & Gynecology

## 2022-04-11 ENCOUNTER — Other Ambulatory Visit: Payer: Self-pay | Admitting: Physician Assistant

## 2022-05-03 ENCOUNTER — Encounter: Payer: Self-pay | Admitting: Obstetrics & Gynecology

## 2022-06-14 DIAGNOSIS — N96 Recurrent pregnancy loss: Secondary | ICD-10-CM | POA: Diagnosis not present

## 2022-08-01 ENCOUNTER — Telehealth: Payer: Self-pay | Admitting: *Deleted

## 2022-08-01 ENCOUNTER — Encounter: Payer: Self-pay | Admitting: Obstetrics & Gynecology

## 2022-08-01 NOTE — Telephone Encounter (Signed)
Patient advised that medical records response was sent on 07/19/22 to requesting office of who to contact to obtain.

## 2022-10-07 DIAGNOSIS — N96 Recurrent pregnancy loss: Secondary | ICD-10-CM | POA: Diagnosis not present

## 2022-11-16 DIAGNOSIS — N96 Recurrent pregnancy loss: Secondary | ICD-10-CM | POA: Diagnosis not present

## 2022-11-16 DIAGNOSIS — E88819 Insulin resistance, unspecified: Secondary | ICD-10-CM | POA: Diagnosis not present

## 2023-01-23 DIAGNOSIS — N96 Recurrent pregnancy loss: Secondary | ICD-10-CM | POA: Diagnosis not present

## 2023-01-23 DIAGNOSIS — N939 Abnormal uterine and vaginal bleeding, unspecified: Secondary | ICD-10-CM | POA: Diagnosis not present

## 2023-01-23 DIAGNOSIS — Z01812 Encounter for preprocedural laboratory examination: Secondary | ICD-10-CM | POA: Diagnosis not present

## 2023-02-02 DIAGNOSIS — N80329 Endometriosis of the posterior cul-de-sac, unspecified depth: Secondary | ICD-10-CM | POA: Diagnosis not present

## 2023-02-02 DIAGNOSIS — N838 Other noninflammatory disorders of ovary, fallopian tube and broad ligament: Secondary | ICD-10-CM | POA: Diagnosis not present

## 2023-02-02 DIAGNOSIS — N809 Endometriosis, unspecified: Secondary | ICD-10-CM | POA: Diagnosis not present

## 2023-02-02 DIAGNOSIS — Z7982 Long term (current) use of aspirin: Secondary | ICD-10-CM | POA: Diagnosis not present

## 2023-02-02 DIAGNOSIS — N946 Dysmenorrhea, unspecified: Secondary | ICD-10-CM | POA: Diagnosis not present

## 2023-02-02 DIAGNOSIS — K219 Gastro-esophageal reflux disease without esophagitis: Secondary | ICD-10-CM | POA: Diagnosis not present

## 2023-02-04 DIAGNOSIS — N8000 Endometriosis of the uterus, unspecified: Secondary | ICD-10-CM | POA: Diagnosis not present

## 2023-02-04 DIAGNOSIS — N80329 Endometriosis of the posterior cul-de-sac, unspecified depth: Secondary | ICD-10-CM | POA: Diagnosis not present

## 2023-06-28 DIAGNOSIS — N96 Recurrent pregnancy loss: Secondary | ICD-10-CM | POA: Diagnosis not present

## 2023-06-28 DIAGNOSIS — E88819 Insulin resistance, unspecified: Secondary | ICD-10-CM | POA: Diagnosis not present

## 2023-06-28 DIAGNOSIS — Z8639 Personal history of other endocrine, nutritional and metabolic disease: Secondary | ICD-10-CM | POA: Diagnosis not present

## 2023-11-07 DIAGNOSIS — R300 Dysuria: Secondary | ICD-10-CM | POA: Diagnosis not present

## 2023-11-07 DIAGNOSIS — N96 Recurrent pregnancy loss: Secondary | ICD-10-CM | POA: Diagnosis not present

## 2023-11-07 DIAGNOSIS — Z23 Encounter for immunization: Secondary | ICD-10-CM | POA: Diagnosis not present

## 2023-11-20 DIAGNOSIS — N96 Recurrent pregnancy loss: Secondary | ICD-10-CM | POA: Diagnosis not present

## 2023-11-20 DIAGNOSIS — O3680X0 Pregnancy with inconclusive fetal viability, not applicable or unspecified: Secondary | ICD-10-CM | POA: Diagnosis not present

## 2023-12-13 DIAGNOSIS — O0990 Supervision of high risk pregnancy, unspecified, unspecified trimester: Secondary | ICD-10-CM | POA: Diagnosis not present

## 2023-12-13 DIAGNOSIS — O09529 Supervision of elderly multigravida, unspecified trimester: Secondary | ICD-10-CM | POA: Diagnosis not present

## 2023-12-13 DIAGNOSIS — Z369 Encounter for antenatal screening, unspecified: Secondary | ICD-10-CM | POA: Diagnosis not present

## 2023-12-13 DIAGNOSIS — Z3491 Encounter for supervision of normal pregnancy, unspecified, first trimester: Secondary | ICD-10-CM | POA: Diagnosis not present

## 2023-12-13 DIAGNOSIS — O418X10 Other specified disorders of amniotic fluid and membranes, first trimester, not applicable or unspecified: Secondary | ICD-10-CM | POA: Diagnosis not present

## 2023-12-13 DIAGNOSIS — R7989 Other specified abnormal findings of blood chemistry: Secondary | ICD-10-CM | POA: Diagnosis not present

## 2023-12-13 DIAGNOSIS — O3680X0 Pregnancy with inconclusive fetal viability, not applicable or unspecified: Secondary | ICD-10-CM | POA: Diagnosis not present

## 2023-12-13 DIAGNOSIS — Z113 Encounter for screening for infections with a predominantly sexual mode of transmission: Secondary | ICD-10-CM | POA: Diagnosis not present

## 2023-12-13 DIAGNOSIS — O468X1 Other antepartum hemorrhage, first trimester: Secondary | ICD-10-CM | POA: Diagnosis not present

## 2023-12-13 DIAGNOSIS — N96 Recurrent pregnancy loss: Secondary | ICD-10-CM | POA: Diagnosis not present

## 2023-12-13 DIAGNOSIS — Z3A09 9 weeks gestation of pregnancy: Secondary | ICD-10-CM | POA: Diagnosis not present

## 2023-12-27 DIAGNOSIS — O468X1 Other antepartum hemorrhage, first trimester: Secondary | ICD-10-CM | POA: Diagnosis not present

## 2023-12-27 DIAGNOSIS — R7989 Other specified abnormal findings of blood chemistry: Secondary | ICD-10-CM | POA: Diagnosis not present

## 2023-12-27 DIAGNOSIS — N96 Recurrent pregnancy loss: Secondary | ICD-10-CM | POA: Diagnosis not present

## 2023-12-27 DIAGNOSIS — Z3A11 11 weeks gestation of pregnancy: Secondary | ICD-10-CM | POA: Diagnosis not present

## 2023-12-27 DIAGNOSIS — O3680X0 Pregnancy with inconclusive fetal viability, not applicable or unspecified: Secondary | ICD-10-CM | POA: Diagnosis not present

## 2023-12-27 DIAGNOSIS — O0990 Supervision of high risk pregnancy, unspecified, unspecified trimester: Secondary | ICD-10-CM | POA: Diagnosis not present

## 2023-12-27 DIAGNOSIS — O418X10 Other specified disorders of amniotic fluid and membranes, first trimester, not applicable or unspecified: Secondary | ICD-10-CM | POA: Diagnosis not present

## 2023-12-27 DIAGNOSIS — O09529 Supervision of elderly multigravida, unspecified trimester: Secondary | ICD-10-CM | POA: Diagnosis not present

## 2024-01-18 DIAGNOSIS — Z882 Allergy status to sulfonamides status: Secondary | ICD-10-CM | POA: Diagnosis not present

## 2024-01-18 DIAGNOSIS — F419 Anxiety disorder, unspecified: Secondary | ICD-10-CM | POA: Diagnosis not present

## 2024-01-18 DIAGNOSIS — N96 Recurrent pregnancy loss: Secondary | ICD-10-CM | POA: Diagnosis not present

## 2024-01-18 DIAGNOSIS — F32A Depression, unspecified: Secondary | ICD-10-CM | POA: Diagnosis not present

## 2024-01-18 DIAGNOSIS — O021 Missed abortion: Secondary | ICD-10-CM | POA: Diagnosis not present

## 2024-01-18 DIAGNOSIS — Z880 Allergy status to penicillin: Secondary | ICD-10-CM | POA: Diagnosis not present

## 2024-01-24 DIAGNOSIS — U071 COVID-19: Secondary | ICD-10-CM | POA: Diagnosis not present

## 2024-01-24 DIAGNOSIS — R051 Acute cough: Secondary | ICD-10-CM | POA: Diagnosis not present

## 2024-01-24 DIAGNOSIS — R52 Pain, unspecified: Secondary | ICD-10-CM | POA: Diagnosis not present

## 2024-01-24 DIAGNOSIS — R0981 Nasal congestion: Secondary | ICD-10-CM | POA: Diagnosis not present
